# Patient Record
Sex: Female | Born: 1985 | Race: White | Hispanic: No | Marital: Married | Smoking: Former smoker
Health system: Southern US, Community
[De-identification: ages and names within clinical notes are randomized; demographics above are authoritative.]

## PROBLEM LIST (undated history)

## (undated) ENCOUNTER — Inpatient Hospital Stay: Payer: Self-pay

## (undated) DIAGNOSIS — O139 Gestational [pregnancy-induced] hypertension without significant proteinuria, unspecified trimester: Secondary | ICD-10-CM

## (undated) DIAGNOSIS — F909 Attention-deficit hyperactivity disorder, unspecified type: Secondary | ICD-10-CM

## (undated) DIAGNOSIS — A4902 Methicillin resistant Staphylococcus aureus infection, unspecified site: Secondary | ICD-10-CM

## (undated) DIAGNOSIS — Z720 Tobacco use: Secondary | ICD-10-CM

## (undated) DIAGNOSIS — F32A Depression, unspecified: Secondary | ICD-10-CM

## (undated) DIAGNOSIS — F329 Major depressive disorder, single episode, unspecified: Secondary | ICD-10-CM

## (undated) DIAGNOSIS — F419 Anxiety disorder, unspecified: Secondary | ICD-10-CM

## (undated) HISTORY — DX: Anxiety disorder, unspecified: F41.9

## (undated) HISTORY — PX: WISDOM TOOTH EXTRACTION: SHX21

## (undated) HISTORY — DX: Methicillin resistant Staphylococcus aureus infection, unspecified site: A49.02

## (undated) HISTORY — DX: Tobacco use: Z72.0

## (undated) HISTORY — DX: Major depressive disorder, single episode, unspecified: F32.9

## (undated) HISTORY — DX: Depression, unspecified: F32.A

---
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
## 2007-03-27 ENCOUNTER — Emergency Department: Payer: Self-pay | Admitting: Emergency Medicine

## 2015-01-20 ENCOUNTER — Ambulatory Visit (INDEPENDENT_AMBULATORY_CARE_PROVIDER_SITE_OTHER): Payer: Managed Care, Other (non HMO) | Admitting: Primary Care

## 2015-01-20 ENCOUNTER — Encounter: Payer: Self-pay | Admitting: Primary Care

## 2015-01-20 VITALS — BP 106/64 | HR 74 | Temp 98.3°F | Ht 65.0 in | Wt 118.4 lb

## 2015-01-20 DIAGNOSIS — Z720 Tobacco use: Secondary | ICD-10-CM | POA: Insufficient documentation

## 2015-01-20 MED ORDER — VARENICLINE TARTRATE 1 MG PO TABS: 1.0000 mg | ORAL_TABLET | Freq: Two times a day (BID) | ORAL | Status: DC

## 2015-01-20 MED ORDER — VARENICLINE TARTRATE 0.5 MG X 11 & 1 MG X 42 PO MISC: ORAL | Status: DC

## 2015-01-20 NOTE — Patient Instructions (Signed)
Start Chantix Starter Pak and follow package instructions. You should start the medication within 2-3 weeks of your quit date. You should determine a quit date prior to starting this medication.  Continue with the Continuing Pak once finished with the [employer]. If your insurance does not cover this medication, have the pharmacy to send a "Prior Authorization". We will complete this form.  It was a pleasure to meet you today! Please don't hesitate to call me with any questions. Welcome to [facility]!  Follow up in 1 month for re-evaluation.  Varenicline oral tablets What is this medicine? VARENICLINE (var EN i kleen) is used to help people quit smoking. It can reduce the symptoms caused by stopping smoking. It is used with a patient support program recommended by your physician. This medicine may be used for other purposes; ask your health care provider or pharmacist if you have questions. COMMON BRAND NAME(S): Chantix What should I tell my health care provider before I take this medicine? They need to know if you have any of these conditions: -bipolar disorder, depression, schizophrenia or other mental illness -heart disease -if you often drink alcohol -kidney disease -peripheral vascular disease -seizures -stroke -suicidal thoughts, plans, or attempt; a previous suicide attempt by you or a family member -an unusual or allergic reaction to varenicline, other medicines, foods, dyes, or preservatives -pregnant or trying to get pregnant -breast-feeding How should I use this medicine? You should set a date to stop smoking and tell your doctor. Start this medicine one week before the quit date. You can also start taking this medicine before you choose a quit date, and then pick a quit date that is between 8 and 35 days of treatment with this medicine. Stick to your plan; ask about support groups or other ways to help you remain a 'quitter'. Take this medicine by mouth after eating. Take with  a full glass of water. Follow the directions on the prescription label. Take your doses at regular intervals. Do not take your medicine more often than directed. A special MedGuide will be given to you by the pharmacist with each prescription and refill. Be sure to read this information carefully each time. Talk to your pediatrician regarding the use of this medicine in children. This medicine is not approved for use in children. Overdosage: If you think you have taken too much of this medicine contact a poison control center or emergency room at once. NOTE: This medicine is only for you. Do not share this medicine with others. What if I miss a dose? If you miss a dose, take it as soon as you can. If it is almost time for your next dose, take only that dose. Do not take double or extra doses. What may interact with this medicine? -alcohol or any product that contains alcohol -insulin -other stop smoking aids -theophylline -warfarin This list may not describe all possible interactions. Give your health care provider a list of all the medicines, herbs, non-prescription drugs, or dietary supplements you use. Also tell them if you smoke, drink alcohol, or use illegal drugs. Some items may interact with your medicine. What should I watch for while using this medicine? Visit your doctor or health care professional for regular check ups. Ask for ongoing advice and encouragement from your doctor or healthcare professional, friends, and family to help you quit. If you smoke while on this medication, quit again Your mouth may get dry. Chewing sugarless gum or sucking hard candy, and drinking plenty of  water may help. Contact your doctor if the problem does not go away or is severe. You may get drowsy or dizzy. Do not drive, use machinery, or do anything that needs mental alertness until you know how this medicine affects you. Do not stand or sit up quickly, especially if you are an older patient. This reduces  the risk of dizzy or fainting spells. The use of this medicine may increase the chance of suicidal thoughts or actions. Pay special attention to how you are responding while on this medicine. Any worsening of mood, or thoughts of suicide or dying should be reported to your health care professional right away. What side effects may I notice from receiving this medicine? Side effects that you should report to your doctor or health care professional as soon as possible: -allergic reactions like skin rash, itching or hives, swelling of the face, lips, tongue, or throat -breathing problems -changes in vision -chest pain or chest tightness -confusion, trouble speaking or understanding -fast, irregular heartbeat -feeling faint or lightheaded, falls -fever -pain in legs when walking -problems with balance, talking, walking -redness, blistering, peeling or loosening of the skin, including inside the mouth -ringing in ears -seizures -sudden numbness or weakness of the face, arm or leg -suicidal thoughts or other mood changes -trouble passing urine or change in the amount of urine -unusual bleeding or bruising -unusually weak or tired Side effects that usually do not require medical attention (report to your doctor or health care professional if they continue or are bothersome): -constipation -headache -nausea, vomiting -strange dreams -stomach gas -trouble sleeping This list may not describe all possible side effects. Call your doctor for medical advice about side effects. You may report side effects to FDA at 1-800-FDA-1088. Where should I keep my medicine? Keep out of the reach of children. Store at room temperature between 15 and 30 degrees C (59 and 86 degrees F). Throw away any unused medicine after the expiration date. NOTE: This sheet is a summary. It may not cover all possible information. If you have questions about this medicine, talk to your doctor, pharmacist, or health care  provider.  2015, Elsevier/Gold Standard. (2013-05-07 13:37:47)

## 2015-01-20 NOTE — Progress Notes (Signed)
Subjective:    Patient ID: Carrie Vasquez, female    DOB: 04-19-1986, 29 y.o.   MRN: [id number]  HPI  Carrie Vasquez is a 29 year old female who presents today to establish care and discuss the problems mentioned below.   1) Tobacco abuse: Smoked cigarettes for the past 10 years. She's recently cut back to 10 cigarettes daily from one pack per day. She's been working on quitting over the past year and has, at most, made it about 3 weeks before starting back up again. She's tried e-cigarettes, patches, gum, and quitting "cold [place]" without permanent cessation. She has an occasional cough and some shortness of breath when exercising. Denies hemoptysis and chest pain. She's has a history of snoring reports worsening snoring over the past several years. She does wake a night (per boyfriend), but doesn't recall, and will experience some daytime tiredness. She is very motivated to quit smoking.  Review of Systems  Constitutional: Negative for unexpected weight change.  HENT: Negative for rhinorrhea.   Respiratory: Negative for cough and shortness of breath.   Cardiovascular: Negative for chest pain.  Gastrointestinal: Negative for diarrhea and constipation.       Belly button crusting intermittently for years.  Genitourinary: Negative for dysuria and frequency.  Musculoskeletal: Negative for myalgias and arthralgias.  Skin: Negative for rash.  Allergic/Immunologic: Positive for environmental allergies.       Worse in Spring, will take local honey.  Neurological: Negative for dizziness and headaches.  Psychiatric/Behavioral:       Denies concerns for anxiety or depression.       Past Medical History  Diagnosis Date  . Tobacco abuse     History   Social History  . Marital Status: Unknown    Spouse Name: N/A  . Number of Children: N/A  . Years of Education: N/A   Occupational History  . Not on file.   Social History Main Topics  . Smoking status: Current Some Day Smoker  .  Smokeless tobacco: Not on file  . Alcohol Use: 0.0 oz/week    0 Standard drinks or equivalent per week  . Drug Use: Not on file  . Sexual Activity: Not on file   Other Topics Concern  . Not on file   Social History Narrative   Single. Getting married next May.   Works as a Hospital doctor.   Highest level of education completed GED with some college.   Enjoys working out, Tourist information centre manager.       Past Surgical History  Procedure Laterality Date  . Adenoidectomy  1996  . Appendectomy  1993    Family History  Problem Relation Age of Onset  . Hypertension Paternal Grandfather   . Diabetes Maternal Grandfather     Allergies  Allergen Reactions  . Sulfa Antibiotics Hives    No current outpatient prescriptions on file prior to visit.   No current facility-administered medications on file prior to visit.    BP 106/64 mmHg  Pulse 74  Temp(Src) 98.3 F (36.8 C) (Oral)  Ht 5\' 5"  (1.651 m)  Wt 118 lb 6.4 oz (53.706 kg)  BMI 19.70 kg/m2  SpO2 98%  LMP 01/13/2015     Objective:   Physical Exam  Constitutional: She appears well-nourished.  Cardiovascular: Normal rate and regular rhythm.   Pulmonary/Chest: Effort normal and breath sounds normal.  Abdominal: Soft. Bowel sounds are normal.  Skin: Skin is warm and dry.  Psychiatric: She has a normal mood and  affect.          Assessment & Plan:

## 2015-01-20 NOTE — Progress Notes (Signed)
Pre visit review using our clinic review tool, if applicable. No additional management support is needed unless otherwise documented below in the visit note. 

## 2015-01-20 NOTE — Assessment & Plan Note (Signed)
Smokes cigarettes for 10 years, 1 PPD. Numerous attempts to quit over 1 year including gum, patches, cold [place], without permanent cessation. RX for Chantix starting and continuing packs with specific instructions provided. Follow up in 1 month or sooner if needed.

## 2015-01-28 ENCOUNTER — Telehealth: Payer: Self-pay | Admitting: Primary Care

## 2015-02-05 ENCOUNTER — Telehealth: Payer: Self-pay | Admitting: Primary Care

## 2015-02-05 NOTE — Telephone Encounter (Signed)
I left a message for the patient to return my call.

## 2015-02-05 NOTE — Telephone Encounter (Signed)
Will you find out if Ms. Drinkard purchased the Chantix? I see that it was denied by her insurance.  Thanks

## 2015-02-05 NOTE — Telephone Encounter (Signed)
Received request for Prior Authorization for Chantix.  Started a PA on 01/29/15 but did not hear back from insurance. Sent  2nd PA to [employer] on 01/31/15. Recevied a fax from them that it should have went to [facility]. Sent  3rd PA to [facility] on 02/04/15.  [id number] PA was deny. The medication is a plan exclusion and is not a covered benefit on this patient's plan.

## 2015-02-20 ENCOUNTER — Ambulatory Visit: Payer: Managed Care, Other (non HMO) | Admitting: Primary Care

## 2015-02-28 LAB — OB RESULTS CONSOLE RUBELLA ANTIBODY, IGM: RUBELLA: IMMUNE

## 2015-02-28 LAB — OB RESULTS CONSOLE RPR: RPR: NONREACTIVE

## 2015-02-28 LAB — OB RESULTS CONSOLE HEPATITIS B SURFACE ANTIGEN: Hepatitis B Surface Ag: NEGATIVE

## 2015-02-28 LAB — OB RESULTS CONSOLE ABO/RH: RH TYPE: POSITIVE

## 2015-02-28 LAB — OB RESULTS CONSOLE ANTIBODY SCREEN: ANTIBODY SCREEN: NEGATIVE

## 2015-02-28 LAB — OB RESULTS CONSOLE VARICELLA ZOSTER ANTIBODY, IGG: Varicella: NON-IMMUNE/NOT IMMUNE

## 2015-02-28 LAB — OB RESULTS CONSOLE HIV ANTIBODY (ROUTINE TESTING): HIV: NONREACTIVE

## 2015-08-10 NOTE — L&D Delivery Note (Signed)
Deliver Note   Date of Delivery:   10/20/2015 Primary OB:   [facility] Gestational Age/EDD: 5458w0d by 10/20/2015, by Last Menstrual Period  Antepartum complications:  OB History    Gravida Para Term Preterm AB TAB SAB Ectopic Multiple Living   1 0 0 0 0 0 0 0 0 0       Delivered By:   Vena AustriaStaebler, Shatonya Passon MD  Delivery Type:   TSVD Anesthesia:     epidural  Intrapartum complications:  GBS:    Negative (02/17 0000) Laceration:    2nd degree Episiotomy:    none Placenta:    Spontaneous Estimated Blood Loss:  400mL Baby:     Liveborn female  APGAR (1 MIN):  8 APGAR (5 MINS): 9  Weight 8lbs 5oz, 3770g   Deliver Details   At 01:23 a viable female infant was delivered via  TSVD (Presentation: LOA  ).  APGAR: 8, 9; weight 8lbs 5oz, 3770g.   Placenta status: spontaneous, intact.  Cord: 3VC, nuchal x 1 with the following complications: induction of labor for gestational hypertension, gestational thrombocytopenia.    Mom to postpartum.  Baby to Couplet care / Skin to Skin.

## 2015-09-26 LAB — OB RESULTS CONSOLE GC/CHLAMYDIA
Chlamydia: NEGATIVE
GC PROBE AMP, GENITAL: NEGATIVE

## 2015-09-26 LAB — OB RESULTS CONSOLE GBS: GBS: NEGATIVE

## 2015-10-17 ENCOUNTER — Inpatient Hospital Stay
Admission: EM | Admit: 2015-10-17 | Discharge: 2015-10-17 | Disposition: A | Discharge status: Home / Self Care | Payer: Managed Care, Other (non HMO) | Source: Home / Self Care | Admitting: Obstetrics and Gynecology

## 2015-10-17 DIAGNOSIS — Z3A39 39 weeks gestation of pregnancy: Secondary | ICD-10-CM

## 2015-10-17 DIAGNOSIS — O133 Gestational [pregnancy-induced] hypertension without significant proteinuria, third trimester: Secondary | ICD-10-CM

## 2015-10-17 DIAGNOSIS — Z87891 Personal history of nicotine dependence: Secondary | ICD-10-CM

## 2015-10-17 LAB — COMPREHENSIVE METABOLIC PANEL
ALT: 16 U/L (ref 14–54)
AST: 21 U/L (ref 15–41)
Albumin: 3 g/dL — ABNORMAL LOW (ref 3.5–5.0)
Alkaline Phosphatase: 139 U/L — ABNORMAL HIGH (ref 38–126)
Anion gap: 6 (ref 5–15)
BILIRUBIN TOTAL: 0.8 mg/dL (ref 0.3–1.2)
BUN: 6 mg/dL (ref 6–20)
CO2: 21 mmol/L — ABNORMAL LOW (ref 22–32)
CREATININE: 0.61 mg/dL (ref 0.44–1.00)
Calcium: 8.5 mg/dL — ABNORMAL LOW (ref 8.9–10.3)
Chloride: 108 mmol/L (ref 101–111)
GFR calc Af Amer: >60 mL/min (ref >60)
Glucose, Bld: 76 mg/dL (ref 65–99)
Potassium: 3.5 mmol/L (ref 3.5–5.1)
Sodium: 135 mmol/L (ref 135–145)
TOTAL PROTEIN: 6.1 g/dL — AB (ref 6.5–8.1)

## 2015-10-17 LAB — CBC
HCT: 40.2 % (ref 35.0–47.0)
Hemoglobin: 13.9 g/dL (ref 12.0–16.0)
MCH: 31.5 pg (ref 26.0–34.0)
MCHC: 34.6 g/dL (ref 32.0–36.0)
MCV: 91.1 fL (ref 80.0–100.0)
PLATELETS: 120 10*3/uL — AB (ref 150–440)
RBC: 4.41 MIL/uL (ref 3.80–5.20)
RDW: 13.5 % (ref 11.5–14.5)
WBC: 8.4 10*3/uL (ref 3.6–11.0)

## 2015-10-17 LAB — PROTEIN / CREATININE RATIO, URINE
Creatinine, Urine: 88 mg/dL
Protein Creatinine Ratio: 0.17 mg/mg{creat} — ABNORMAL HIGH (ref 0.00–0.15)
Total Protein, Urine: 15 mg/dL

## 2015-10-17 MED ORDER — LABETALOL HCL 5 MG/ML IV SOLN
20.0000 mg | INTRAVENOUS | Status: DC | PRN
Start: 2015-10-17 — End: 2015-10-17

## 2015-10-17 MED ORDER — HYDRALAZINE HCL 20 MG/ML IJ SOLN: 10.0000 mg | Freq: Once | INTRAMUSCULAR | Status: DC | PRN

## 2015-10-17 NOTE — Discharge Summary (Signed)
Obstetric History and Physical  Carrie Vasquez is a 30 y.o. G1P0 with Estimated Date of Delivery: 10/20/15 per LMP and 9 wk [place] who presents at [redacted]w[redacted]d  presenting for evaluation of elevated BP reading at office today (130/90.) Patient states she has been having no contractions, no vaginal bleeding, intact membranes, with active fetal movement. Denies ha, visual changes or RUQ pain. C/o rt should pain.   Prenatal Course Source of Care: [facility]  with onset of care at 6 weeks Pregnancy complications or risks: -H/o anxiety/depression - no meds -h/o MRSA -H/o tobacco use  She plans to breastfeed She desires condoms for postpartum contraception.   Prenatal labs and studies: ABO, Rh: O+  Antibody: Neg Rubella: Immune Varicella: non-Immune RPR:  NR HBsAg:  Neg HIV: Neg GC/CT: Neg/Neg GBS: Negative 1 hr Glucola: 108   Genetic screening: Informaseq negative   TDAP: UTD   Prenatal Transfer Tool   Past Medical History  Diagnosis Date  . Tobacco abuse   -H/o MRSA with negative cultures during this pregnancy  Past Surgical History  Procedure Laterality Date  . Adenoidectomy  1996  . Appendectomy  1993  -Wisdom Teeth  OB History  Gravida Para Term Preterm AB SAB TAB Ectopic Multiple Living  1             # Outcome Date GA Lbr Len/2nd Weight Sex Delivery Anes PTL Lv  1 Current               Social History   Social History  . Marital Status: Unknown    Spouse Name: N/A  . Number of Children: N/A  . Years of Education: N/A   Social History Main Topics  . Smoking status: Former  . Smokeless tobacco: Not on file  . Alcohol Use: 0.0 oz/week    0 Standard drinks or equivalent per week  . Drug Use: Not on file  . Sexual Activity: Not on file   Other Topics Concern  . Not on file     Family History  Problem Relation Age of Onset  . Hypertension Paternal Grandfather   . Diabetes Maternal Grandfather     Prescriptions prior to admission  Medication Sig Dispense Refill  Last Dose  . valACYclovir (VALTREX) 500 MG tablet Take 1,000 mg by mouth daily.     . Prenatal Vitamins      .         Allergies  Allergen Reactions  . Sulfa Antibiotics Hives    Review of Systems: Negative except for what is mentioned in HPI.  Physical Exam: BP 134/91 mmHg  Pulse 90  Temp(Src) 97.9 F (36.6 C) (Oral)  Resp 18  Ht  (1.626 m)  Wt 160 lb (72.576 kg)  BMI 27.45 kg/m2  LMP 01/13/2015 BP: 139/94, 127/92, 130/91, 136/88, 142/87, 132/90, 134/90, 134/91 GENERAL: Well-developed, well-nourished female in no acute distress.  ABDOMEN: Soft, nontender, nondistended, gravid. EXTREMITIES: Nontender, no edema Cervical Exam: Dilatation 1 cm   Effacement 40-50%   Station -1   Presentation: cephalic FHT: Category: 1 Variability moderate  Accelerations present   Decelerations none Contractions: irregular   Pertinent Labs/Studies:   Results for orders placed or performed during the hospital encounter of 10/17/15 (from the past 24 hour(s))  Comprehensive metabolic panel     Status: Abnormal   Collection Time: 10/17/15 12:07 PM  Result Value Ref Range   Sodium 135 135 - 145 mmol/L   Potassium 3.5 3.5 - 5.1 mmol/L   Chloride 108  101 - 111 mmol/L   CO2 21 (L) 22 - 32 mmol/L   Glucose, Bld 76 65 - 99 mg/dL   BUN 6 6 - 20 mg/dL   Creatinine, Ser 1.610.61 0.44 - 1.00 mg/dL   Calcium 8.5 (L) 8.9 - 10.3 mg/dL   Total Protein 6.1 (L) 6.5 - 8.1 g/dL   Albumin 3.0 (L) 3.5 - 5.0 g/dL   AST 21 15 - 41 U/L   ALT 16 14 - 54 U/L   Alkaline Phosphatase 139 (H) 38 - 126 U/L   Total Bilirubin 0.8 0.3 - 1.2 mg/dL   GFR calc non Af Amer >60 >60 mL/min   GFR calc Af Amer >60 >60 mL/min   Anion gap 6 5 - 15  CBC     Status: Abnormal   Collection Time: 10/17/15 12:07 PM  Result Value Ref Range   WBC 8.4 3.6 - 11.0 K/uL   RBC 4.41 3.80 - 5.20 MIL/uL   Hemoglobin 13.9 12.0 - 16.0 g/dL   HCT 09.640.2 04.535.0 - 40.947.0 %   MCV 91.1 80.0 - 100.0 fL   MCH 31.5 26.0 - 34.0 pg   MCHC 34.6 32.0 - 36.0  g/dL   RDW 81.113.5 91.411.5 - 78.214.5 %   Platelets 120 (L) 150 - 440 K/uL  Protein / creatinine ratio, urine     Status: Abnormal   Collection Time: 10/17/15 12:07 PM  Result Value Ref Range   Creatinine, Urine 88 mg/dL   Total Protein, Urine 15 mg/dL   Protein Creatinine Ratio 0.17 (H) 0.00 - 0.15 mg/mg[Cre]    Assessment : IUP at 376w4d, gestational hypertension  Plan: GHTN: Reviewed dx of GHTN and recommendation for Induction of labor as she is >37 weeks. Reviewed progression of hypertension disease in pregnancy and risks to mom and baby including seizures and organ damage. Also reviewed risks of induction of labor and plan of management given current cervical dilation. Pt declined induction of labor today, verbalizing strong desire to forgo induction until at least due date. Decision made for pt to return to L&D tomorrow at 4 pm for evaluation and that she will most likely remain in-pt for induction of labor then.

## 2015-10-17 NOTE — OB Triage Note (Signed)
Carrie Vasquez sent over from office for pre-eclampsia evaluation, elevated BP in office, swelling in feet and ankles, right shoulder pain. Denies headaches, visual changes or any other pain. Reports positive fetal movement. 2+ pitting edema bilateral lower extremities.

## 2015-10-17 NOTE — Progress Notes (Signed)
Patient to return for NST and labs tomorrow, with possible IOL for gestational hypertension,

## 2015-10-18 ENCOUNTER — Inpatient Hospital Stay
Admission: EM | Admit: 2015-10-18 | Discharge: 2015-10-22 | DRG: 775 | Disposition: A | Discharge status: Home / Self Care | Payer: Managed Care, Other (non HMO) | Attending: Obstetrics and Gynecology | Admitting: Obstetrics and Gynecology

## 2015-10-18 DIAGNOSIS — O134 Gestational [pregnancy-induced] hypertension without significant proteinuria, complicating childbirth: Principal | ICD-10-CM | POA: Diagnosis present

## 2015-10-18 DIAGNOSIS — Z8249 Family history of ischemic heart disease and other diseases of the circulatory system: Secondary | ICD-10-CM

## 2015-10-18 DIAGNOSIS — Z882 Allergy status to sulfonamides status: Secondary | ICD-10-CM | POA: Diagnosis not present

## 2015-10-18 DIAGNOSIS — Z820 Family history of epilepsy and other diseases of the nervous system: Secondary | ICD-10-CM

## 2015-10-18 DIAGNOSIS — D6959 Other secondary thrombocytopenia: Secondary | ICD-10-CM | POA: Diagnosis present

## 2015-10-18 DIAGNOSIS — O163 Unspecified maternal hypertension, third trimester: Secondary | ICD-10-CM | POA: Diagnosis present

## 2015-10-18 DIAGNOSIS — Z806 Family history of leukemia: Secondary | ICD-10-CM | POA: Diagnosis not present

## 2015-10-18 DIAGNOSIS — Z8759 Personal history of other complications of pregnancy, childbirth and the puerperium: Secondary | ICD-10-CM | POA: Diagnosis present

## 2015-10-18 DIAGNOSIS — O9912 Other diseases of the blood and blood-forming organs and certain disorders involving the immune mechanism complicating childbirth: Secondary | ICD-10-CM | POA: Diagnosis present

## 2015-10-18 DIAGNOSIS — Z833 Family history of diabetes mellitus: Secondary | ICD-10-CM

## 2015-10-18 DIAGNOSIS — Z8614 Personal history of Methicillin resistant Staphylococcus aureus infection: Secondary | ICD-10-CM

## 2015-10-18 DIAGNOSIS — Z87891 Personal history of nicotine dependence: Secondary | ICD-10-CM

## 2015-10-18 DIAGNOSIS — Z9049 Acquired absence of other specified parts of digestive tract: Secondary | ICD-10-CM | POA: Diagnosis not present

## 2015-10-18 DIAGNOSIS — Z3A39 39 weeks gestation of pregnancy: Secondary | ICD-10-CM | POA: Diagnosis not present

## 2015-10-18 DIAGNOSIS — Z9889 Other specified postprocedural states: Secondary | ICD-10-CM

## 2015-10-18 DIAGNOSIS — O139 Gestational [pregnancy-induced] hypertension without significant proteinuria, unspecified trimester: Secondary | ICD-10-CM

## 2015-10-18 HISTORY — DX: Gestational (pregnancy-induced) hypertension without significant proteinuria, unspecified trimester: O13.9

## 2015-10-18 LAB — CBC
HCT: 40.3 % (ref 35.0–47.0)
Hemoglobin: 13.6 g/dL (ref 12.0–16.0)
MCH: 31.1 pg (ref 26.0–34.0)
MCHC: 33.8 g/dL (ref 32.0–36.0)
MCV: 92.1 fL (ref 80.0–100.0)
PLATELETS: 128 10*3/uL — AB (ref 150–440)
RBC: 4.37 MIL/uL (ref 3.80–5.20)
RDW: 13.8 % (ref 11.5–14.5)
WBC: 7.8 10*3/uL (ref 3.6–11.0)

## 2015-10-18 LAB — COMPREHENSIVE METABOLIC PANEL
ALT: 14 U/L (ref 14–54)
AST: 22 U/L (ref 15–41)
Albumin: 2.9 g/dL — ABNORMAL LOW (ref 3.5–5.0)
Alkaline Phosphatase: 151 U/L — ABNORMAL HIGH (ref 38–126)
Anion gap: 6 (ref 5–15)
BILIRUBIN TOTAL: 0.5 mg/dL (ref 0.3–1.2)
BUN: 10 mg/dL (ref 6–20)
CO2: 24 mmol/L (ref 22–32)
CREATININE: 0.65 mg/dL (ref 0.44–1.00)
Calcium: 9 mg/dL (ref 8.9–10.3)
Chloride: 106 mmol/L (ref 101–111)
Glucose, Bld: 75 mg/dL (ref 65–99)
POTASSIUM: 4 mmol/L (ref 3.5–5.1)
Sodium: 136 mmol/L (ref 135–145)
TOTAL PROTEIN: 5.7 g/dL — AB (ref 6.5–8.1)

## 2015-10-18 LAB — PROTEIN / CREATININE RATIO, URINE
Creatinine, Urine: 70 mg/dL
Protein Creatinine Ratio: 0.13 mg/mg{Cre} (ref 0.00–0.15)
TOTAL PROTEIN, URINE: 9 mg/dL

## 2015-10-18 LAB — ABO/RH: ABO/RH(D): O POS

## 2015-10-18 LAB — TYPE AND SCREEN
ABO/RH(D): O POS
ANTIBODY SCREEN: NEGATIVE

## 2015-10-18 MED ORDER — LIDOCAINE HCL (PF) 1 % IJ SOLN
INTRAMUSCULAR | Status: AC
  Filled 2015-10-18: qty 30

## 2015-10-18 MED ORDER — TERBUTALINE SULFATE 1 MG/ML IJ SOLN
0.2500 mg | Freq: Once | INTRAMUSCULAR | Status: AC | PRN
  Administered 2015-10-18: 0.25 mg via SUBCUTANEOUS

## 2015-10-18 MED ORDER — AMMONIA AROMATIC IN INHA
RESPIRATORY_TRACT | Status: AC
  Filled 2015-10-18: qty 10

## 2015-10-18 MED ORDER — ACETAMINOPHEN 325 MG PO TABS: 650.0000 mg | ORAL_TABLET | ORAL | Status: DC | PRN

## 2015-10-18 MED ORDER — LACTATED RINGERS IV SOLN: 500.0000 mL | INTRAVENOUS | Status: DC | PRN

## 2015-10-18 MED ORDER — MISOPROSTOL 200 MCG PO TABS
ORAL_TABLET | ORAL | Status: AC
  Filled 2015-10-18: qty 4

## 2015-10-18 MED ORDER — OXYTOCIN 40 UNITS IN LACTATED RINGERS INFUSION - SIMPLE MED
2.5000 [IU]/h | INTRAVENOUS | Status: DC
  Administered 2015-10-20: 2.5 [IU]/h via INTRAVENOUS

## 2015-10-18 MED ORDER — TERBUTALINE SULFATE 1 MG/ML IJ SOLN
INTRAMUSCULAR | Status: AC
  Administered 2015-10-18: 0.25 mg via SUBCUTANEOUS
  Filled 2015-10-18: qty 1

## 2015-10-18 MED ORDER — OXYTOCIN 10 UNIT/ML IJ SOLN
INTRAMUSCULAR | Status: DC
Start: 2015-10-18 — End: 2015-10-20
  Filled 2015-10-18: qty 2

## 2015-10-18 MED ORDER — ONDANSETRON HCL 4 MG/2ML IJ SOLN
4.0000 mg | Freq: Four times a day (QID) | INTRAMUSCULAR | Status: DC | PRN
  Administered 2015-10-19: 4 mg via INTRAVENOUS
  Filled 2015-10-18: qty 2

## 2015-10-18 MED ORDER — LIDOCAINE HCL (PF) 1 % IJ SOLN: 30.0000 mL | INTRAMUSCULAR | Status: DC | PRN

## 2015-10-18 MED ORDER — LACTATED RINGERS IV SOLN: INTRAVENOUS | Status: DC

## 2015-10-18 MED ORDER — CITRIC ACID-SODIUM CITRATE 334-500 MG/5ML PO SOLN
30.0000 mL | ORAL | Status: DC | PRN
Start: 2015-10-18 — End: 2015-10-20

## 2015-10-18 MED ORDER — ACETAMINOPHEN 325 MG PO TABS
650.0000 mg | ORAL_TABLET | ORAL | Status: DC | PRN
  Administered 2015-10-18: 650 mg via ORAL
  Filled 2015-10-18: qty 2

## 2015-10-18 MED ORDER — DINOPROSTONE 10 MG VA INST
10.0000 mg | VAGINAL_INSERT | Freq: Once | VAGINAL | Status: AC
  Administered 2015-10-18: 10 mg via VAGINAL
  Filled 2015-10-18: qty 1

## 2015-10-18 MED ORDER — OXYTOCIN 40 UNITS IN LACTATED RINGERS INFUSION - SIMPLE MED
INTRAVENOUS | Status: AC
  Administered 2015-10-19: 1 m[IU]/min via INTRAVENOUS
  Filled 2015-10-18: qty 1000

## 2015-10-18 MED ORDER — OXYTOCIN BOLUS FROM INFUSION: 500.0000 mL | INTRAVENOUS | Status: DC

## 2015-10-18 MED ORDER — BUTORPHANOL TARTRATE 1 MG/ML IJ SOLN: 1.0000 mg | INTRAMUSCULAR | Status: DC | PRN

## 2015-10-18 NOTE — H&P (Signed)
Obstetric H&P   Chief Complaint: Elevated blood pressures  Prenatal Care Provider: [facility]  History of Present Illness: 30 y.o. G1P0000 [redacted]w[redacted]d by 10/20/2015, presenting for follow up of gestational hypertension, gestational thrombocytopenia diagnosed yesterday.  No headaches, vision changes, RUQ/epigastric pain, increased edema.  Reports +FM, no LOF, no VB, irregualar contractions  Pregnancy has otherwise been uncomplicated  ABO, Rh: O/Positive/-- (07/22 0000)  Antibody: Negative (07/22 0000)  Rubella: Immune Varicella: Immune RPR: Nonreactive (07/22 0000)  HBsAg: Negative (07/22 0000)  HIV: Non-reactive (07/22 0000)  1-hr: 108 GBS: Negative (02/17 0000)   TDAP 08/15/15   Review of Systems: 10 point review of systems negative unless otherwise noted in HPI  Past Medical History: Past Medical History  Diagnosis Date  . Tobacco abuse   . Depression     does not take medication  . Anxiety     does not take medication  . MRSA (methicillin resistant Staphylococcus aureus)     h/o MRSA = 3-4 years ago = cleared = culture 7/22 negative  . Pregnancy induced hypertension   . Gestational hypertension 10/18/2015    Past Surgical History: Past Surgical History  Procedure Laterality Date  . Adenoidectomy  1996  . Appendectomy  1993  . Wisdom tooth extraction      one removed at age 72    Family History: Family History  Problem Relation Age of Onset  . Hypertension Paternal Grandfather   . Diabetes Maternal Grandfather   . Cancer Maternal Grandfather     leukemia  . Epilepsy Brother     Social History: Social History   Social History  . Marital Status: Unknown    Spouse Name: N/A  . Number of Children: N/A  . Years of Education: N/A   Occupational History  . Not on file.   Social History Main Topics  . Smoking status: Former Smoker    Quit date: 02/14/2015  . Smokeless tobacco: Not on file  . Alcohol Use: No  . Drug Use: No  . Sexual Activity: Yes   Other Topics  Concern  . Not on file   Social History Narrative   Single. Getting married next May.   Works as a Hospital doctor.   Highest level of education completed GED with some college.   Enjoys working out, Tourist information centre manager.       Medications: Prior to Admission medications   Medication Sig Start Date End Date Taking? Authorizing Provider  Prenatal Vit-Fe Fumarate-FA (PRENATAL MULTIVITAMIN) TABS tablet Take 1 tablet by mouth daily at 12 noon.   Yes Historical Provider, MD  valACYclovir (VALTREX) 500 MG tablet Take 1,000 mg by mouth daily.   Yes Historical Provider, MD    Allergies: Allergies  Allergen Reactions  . Sulfa Antibiotics Hives    Physical Exam: Vitals: Blood pressure 140/98, pulse 75, temperature 98.5 F (36.9 C), temperature source Oral, resp. rate 18, height  (1.626 m), weight 72.576 kg (160 lb), last menstrual period 01/13/2015, SpO2 97 %.  Urine Dip Protein: see P/C ratio  FHT: 140, moderate variability, +accels, no decels Toco: absent  General: NAD HEENT: normocephalic, anicteric Pulmonary: no increased work of breathing Abdomen: Gravid,  Non-tender  [place]: vtx Extremities: no edema  Labs: Results for orders placed or performed during the hospital encounter of 10/18/15 (from the past 24 hour(s))  Protein / creatinine ratio, urine     Status: None   Collection Time: 10/18/15  5:08 PM  Result Value Ref Range   Creatinine,  Urine 70 mg/dL   Total Protein, Urine 9 mg/dL   Protein Creatinine Ratio 0.13 0.00 - 0.15 mg/mg[Cre]  Comprehensive metabolic panel     Status: Abnormal   Collection Time: 10/18/15  5:21 PM  Result Value Ref Range   Sodium 136 135 - 145 mmol/L   Potassium 4.0 3.5 - 5.1 mmol/L   Chloride 106 101 - 111 mmol/L   CO2 24 22 - 32 mmol/L   Glucose, Bld 75 65 - 99 mg/dL   BUN 10 6 - 20 mg/dL   Creatinine, Ser 1.610.65 0.44 - 1.00 mg/dL   Calcium 9.0 8.9 - 09.610.3 mg/dL   Total Protein 5.7 (L) 6.5 - 8.1 g/dL   Albumin 2.9 (L)  3.5 - 5.0 g/dL   AST 22 15 - 41 U/L   ALT 14 14 - 54 U/L   Alkaline Phosphatase 151 (H) 38 - 126 U/L   Total Bilirubin 0.5 0.3 - 1.2 mg/dL   GFR calc non Af Amer >60 >60 mL/min   GFR calc Af Amer >60 >60 mL/min   Anion gap 6 5 - 15  CBC     Status: Abnormal   Collection Time: 10/18/15  5:21 PM  Result Value Ref Range   WBC 7.8 3.6 - 11.0 K/uL   RBC 4.37 3.80 - 5.20 MIL/uL   Hemoglobin 13.6 12.0 - 16.0 g/dL   HCT 04.540.3 40.935.0 - 81.147.0 %   MCV 92.1 80.0 - 100.0 fL   MCH 31.1 26.0 - 34.0 pg   MCHC 33.8 32.0 - 36.0 g/dL   RDW 91.413.8 78.211.5 - 95.614.5 %   Platelets 128 (L) 150 - 440 K/uL    Assessment: 30 y.o. G1P0000 8220w5d by 10/20/2015, with GHTN  Plan: 1) IOL - cervidil tonight  2) Fetus - cat I tracing 1-hr 108 39lbs weight gain this pregnacy  3) GHTN - normotensive to mild range, labs stable from yesterday  4) TDAP - 08/15/15  5) Disposition - pending delivery

## 2015-10-19 ENCOUNTER — Inpatient Hospital Stay: Payer: Managed Care, Other (non HMO) | Admitting: Anesthesiology

## 2015-10-19 ENCOUNTER — Encounter: Payer: Self-pay | Admitting: *Deleted

## 2015-10-19 LAB — PLATELET COUNT: Platelets: 108 10*3/uL — ABNORMAL LOW (ref 150–440)

## 2015-10-19 MED ORDER — PHENYLEPHRINE 40 MCG/ML (10ML) SYRINGE FOR IV PUSH (FOR BLOOD PRESSURE SUPPORT)
80.0000 ug | PREFILLED_SYRINGE | INTRAVENOUS | Status: DC | PRN
  Filled 2015-10-19: qty 2

## 2015-10-19 MED ORDER — FENTANYL 2.5 MCG/ML W/ROPIVACAINE 0.2% IN NS 100 ML EPIDURAL INFUSION (ARMC-ANES)
EPIDURAL | Status: AC
  Administered 2015-10-19: 10 mL/h via EPIDURAL
  Filled 2015-10-19: qty 100

## 2015-10-19 MED ORDER — EPHEDRINE 5 MG/ML INJ
10.0000 mg | INTRAVENOUS | Status: DC | PRN
  Filled 2015-10-19: qty 2

## 2015-10-19 MED ORDER — SODIUM CHLORIDE 0.9 % IJ SOLN
INTRAMUSCULAR | Status: AC
  Filled 2015-10-19: qty 50

## 2015-10-19 MED ORDER — OXYTOCIN 40 UNITS IN LACTATED RINGERS INFUSION - SIMPLE MED
1.0000 m[IU]/min | INTRAVENOUS | Status: DC
  Administered 2015-10-19: 1 m[IU]/min via INTRAVENOUS
  Administered 2015-10-20: 02:00:00 via INTRAVENOUS

## 2015-10-19 MED ORDER — BUTORPHANOL TARTRATE 1 MG/ML IJ SOLN: 1.0000 mg | INTRAMUSCULAR | Status: DC | PRN

## 2015-10-19 MED ORDER — BUPIVACAINE HCL (PF) 0.25 % IJ SOLN
INTRAMUSCULAR | Status: DC | PRN
  Administered 2015-10-19: 5 mL via EPIDURAL

## 2015-10-19 MED ORDER — DIPHENHYDRAMINE HCL 50 MG/ML IJ SOLN: 12.5000 mg | INTRAMUSCULAR | Status: DC | PRN

## 2015-10-19 MED ORDER — SODIUM CHLORIDE 0.9 % IJ SOLN
INTRAMUSCULAR | Status: AC
  Administered 2015-10-19: 3 mL
  Filled 2015-10-19: qty 10

## 2015-10-19 MED ORDER — FENTANYL 2.5 MCG/ML W/ROPIVACAINE 0.2% IN NS 100 ML EPIDURAL INFUSION (ARMC-ANES)
10.0000 mL/h | EPIDURAL | Status: DC
  Administered 2015-10-19: 10 mL/h via EPIDURAL

## 2015-10-19 MED ORDER — LACTATED RINGERS IV SOLN: 500.0000 mL | Freq: Once | INTRAVENOUS | Status: DC

## 2015-10-19 MED ORDER — SODIUM CHLORIDE 0.9% FLUSH
3.0000 mL | Freq: Three times a day (TID) | INTRAVENOUS | Status: DC
  Administered 2015-10-19 (×3): 3 mL via INTRAVENOUS

## 2015-10-19 MED ORDER — FENTANYL CITRATE (PF) 100 MCG/2ML IJ SOLN
INTRAMUSCULAR | Status: AC
  Filled 2015-10-19: qty 2

## 2015-10-19 MED ORDER — TERBUTALINE SULFATE 1 MG/ML IJ SOLN: 0.2500 mg | Freq: Once | INTRAMUSCULAR | Status: DC | PRN

## 2015-10-19 NOTE — Anesthesia Preprocedure Evaluation (Signed)
Anesthesia Evaluation  Patient identified by MRN, date of birth, ID band Patient awake    Reviewed: Allergy & Precautions, NPO status , Patient's Chart, lab work & pertinent test results, reviewed documented beta blocker date and time   Airway Mallampati: II  TM Distance: >3 FB     Dental  (+) Chipped   Pulmonary former smoker,           Cardiovascular hypertension,      Neuro/Psych PSYCHIATRIC DISORDERS Anxiety Depression    GI/Hepatic   Endo/Other    Renal/GU      Musculoskeletal   Abdominal   Peds  Hematology   Anesthesia Other Findings   Reproductive/Obstetrics                             Anesthesia Physical Anesthesia Plan  ASA: II  Anesthesia Plan: Epidural   Post-op Pain Management:    Induction:   Airway Management Planned:   Additional Equipment:   Intra-op Plan:   Post-operative Plan:   Informed Consent: I have reviewed the patients History and Physical, chart, labs and discussed the procedure including the risks, benefits and alternatives for the proposed anesthesia with the patient or authorized representative who has indicated his/her understanding and acceptance.     Plan Discussed with: CRNA  Anesthesia Plan Comments:         Anesthesia Quick Evaluation

## 2015-10-19 NOTE — Progress Notes (Signed)
Report received from J. Harvest Darkannady, Charity fundraiserN. In room to assume care, bedside report completed. Pt in bed on hand and knees, moaning and breathing through contractions. Pitocin started recently and pt desires natural labor. In room to assume care, introduced self to pt, s/o and family present in room. Discussed plan for shift. Questions addressed, understanding and agreement with plan verbalized

## 2015-10-19 NOTE — Progress Notes (Signed)
Subjective:  Painful contractions, slightly less frequent than earlier this afternoon.  Doing nipple stimulation  Objective:   Vitals: Blood pressure 137/95, pulse 83, temperature 98.7 F (37.1 C), temperature source Oral, resp. rate 18, height 5\' 4"  (1.626 m), weight 72.576 kg (160 lb), last menstrual period 01/13/2015, SpO2 97 %. General:  Abdomen: Cervical Exam:  Dilation: 3.5 Effacement (%): 80 Cervical Position: Anterior Station: 0 Presentation: Vertex Exam by:: JTC  FHT: 130, moderate variability, +accels, no decels Toco: q3-525min  Results for orders placed or performed during the hospital encounter of 10/18/15 (from the past 24 hour(s))  Type and screen [facility]     Status: None   Collection Time: 10/18/15  6:50 PM  Result Value Ref Range   ABO/RH(D) O POS    Antibody Screen NEG    Sample Expiration 10/21/2015   ABO/Rh     Status: None   Collection Time: 10/18/15  6:51 PM  Result Value Ref Range   ABO/RH(D) O POS     Assessment:   29 y.o. G1P0000 6025w6d   Plan:   1) Labor - prolonged latent phase no change since noon check will start pitocin for augmentation  2) Fetus - cat I tracing  3) GHTN - normotensive to mild range, will obtain platelet count in case patient desires epidural, prn stadol written

## 2015-10-19 NOTE — Progress Notes (Signed)
Dr Bonney AidStaebler in room speaking with pt about labor progress and Epidural. Discussed recent lab results. Pt agrees she would like to have an Epidural now. LR bolus started. Dr Maisie Fushomas, anesthesia, called and notified. PLT 108.

## 2015-10-19 NOTE — Anesthesia Procedure Notes (Signed)
Epidural Patient location during procedure: OB  Staffing Anesthesiologist: Berdine AddisonHOMAS, Deaglan Lile Performed by: anesthesiologist   Preanesthetic Checklist Completed: patient identified, site marked, surgical consent, pre-op evaluation, timeout performed, IV checked, risks and benefits discussed and monitors and equipment checked  Epidural Patient position: sitting Prep: Betadine Patient monitoring: heart rate, continuous pulse ox and blood pressure Approach: midline Location: L4-L5 Injection technique: LOR saline  Needle:  Needle type: Tuohy  Needle gauge: 18 G Needle length: 9 cm and 9 Catheter type: closed end flexible Catheter size: 20 Guage Test dose: negative and 1.5% lidocaine with Epi 1:200 K  Assessment Sensory level: T10 Events: blood not aspirated, injection not painful, no injection resistance, negative IV test and no paresthesia  Additional Notes   Patient tolerated the insertion well without complications.1940 in. 1956 catheter. 1958 test. 2001 bolus 2010 infusion.Reason for block:procedure for pain

## 2015-10-19 NOTE — Progress Notes (Signed)
Subjective:  Comfortable  Objective:   Vitals: Blood pressure 113/69, pulse 77, temperature 98.5 F (36.9 C), temperature source Oral, resp. rate 16, height 5\' 4"  (1.626 m), weight 72.576 kg (160 lb), last menstrual period 01/13/2015, SpO2 97 %. General: NAD Abdomen: gravid, non-tender Cervical Exam:  Dilation: 10 Dilation Complete Date: 10/19/15 Dilation Complete Time: 2245 Effacement (%): 100 Cervical Position: Anterior Station: 0 Presentation: Vertex Exam by:: Colletta MarylandN. Sullivan, RN  FHT: 130, moderate, positive accels, no decels Toco:   Results for orders placed or performed during the hospital encounter of 10/18/15 (from the past 24 hour(s))  Platelet count     Status: Abnormal   Collection Time: 10/19/15  5:59 PM  Result Value Ref Range   Platelets 108 (L) 150 - 440 K/uL    Assessment:   30 y.o. G1P0000 3132w6d IOL GHTN  Plan:   1) Labor - continue pitocin, not having much feeling or feedback when pushing because of epidural will allow to labor down over the next hour then start pushing  2) Fetus - category I tracing  3) GHTN - mild range BP, platelets down to 108 last check

## 2015-10-19 NOTE — Progress Notes (Signed)
Dr Maisie Fushomas in room to place Epidural. Pt assisted with getting into position. LR bolus infusing, procedure explained to pt. s/o standing in front of pt, supportive and coaching pt on breathing through contractions. FHR 130's. 1945 - timeout performed for procedure. Consent signed and in chart 1956 - Epidural cath placed, pt tolerating procedure well 1958 - 3cc of 1.5% lidocaine with epi injected. Pt denies any unusual sensation or ringing in ear. Pt remains awake and alert, able to wiggle all toes and responding appropriately.  2001 - loading bolus, 5cc 0.25 % marcaine injected 2003 - Epidural dressing placed, pt assisted with lying down on Lt side. Still feeling pain and pressure in R+ L hips and back. Peanut ball placed between legs, pt states that already feels better.  2010 - pump setup and started at 20cc/hr via alaris pump, per Dr Maisie Fushomas verbal order received to change rate to 10cc/hr in an hour.

## 2015-10-19 NOTE — Progress Notes (Signed)
Late entry Subjective:  Doing well increasing intensity of contractions  Objective:   Vitals: Blood pressure 134/89, pulse 74, temperature 98.8 F (37.1 C), temperature source Oral, resp. rate 16, height 5\' 4"  (1.626 m), weight 72.576 kg (160 lb), last menstrual period 01/13/2015, SpO2 97 %. General: NAD Abdomen: gravid non-tender Cervical Exam: 2/50/-3 per nursing clear SROM 0700  Cat I tracing contraction q623min  Results for orders placed or performed during the hospital encounter of 10/18/15 (from the past 24 hour(s))  Protein / creatinine ratio, urine     Status: None   Collection Time: 10/18/15  5:08 PM  Result Value Ref Range   Creatinine, Urine 70 mg/dL   Total Protein, Urine 9 mg/dL   Protein Creatinine Ratio 0.13 0.00 - 0.15 mg/mg[Cre]  Comprehensive metabolic panel     Status: Abnormal   Collection Time: 10/18/15  5:21 PM  Result Value Ref Range   Sodium 136 135 - 145 mmol/L   Potassium 4.0 3.5 - 5.1 mmol/L   Chloride 106 101 - 111 mmol/L   CO2 24 22 - 32 mmol/L   Glucose, Bld 75 65 - 99 mg/dL   BUN 10 6 - 20 mg/dL   Creatinine, Ser 1.610.65 0.44 - 1.00 mg/dL   Calcium 9.0 8.9 - 09.610.3 mg/dL   Total Protein 5.7 (L) 6.5 - 8.1 g/dL   Albumin 2.9 (L) 3.5 - 5.0 g/dL   AST 22 15 - 41 U/L   ALT 14 14 - 54 U/L   Alkaline Phosphatase 151 (H) 38 - 126 U/L   Total Bilirubin 0.5 0.3 - 1.2 mg/dL   GFR calc non Af Amer >60 >60 mL/min   GFR calc Af Amer >60 >60 mL/min   Anion gap 6 5 - 15  CBC     Status: Abnormal   Collection Time: 10/18/15  5:21 PM  Result Value Ref Range   WBC 7.8 3.6 - 11.0 K/uL   RBC 4.37 3.80 - 5.20 MIL/uL   Hemoglobin 13.6 12.0 - 16.0 g/dL   HCT 04.540.3 40.935.0 - 81.147.0 %   MCV 92.1 80.0 - 100.0 fL   MCH 31.1 26.0 - 34.0 pg   MCHC 33.8 32.0 - 36.0 g/dL   RDW 91.413.8 78.211.5 - 95.614.5 %   Platelets 128 (L) 150 - 440 K/uL  Type and screen [facility]     Status: None   Collection Time: 10/18/15  6:50 PM  Result Value Ref Range   ABO/RH(D) O POS     Antibody Screen NEG    Sample Expiration 10/21/2015   ABO/Rh     Status: None   Collection Time: 10/18/15  6:51 PM  Result Value Ref Range   ABO/RH(D) O POS     Assessment:   29 y.o. G1P0000 8851w6d IOL for GHTN at term  Plan:   1) Labor - had tachysystole on cervidil which was removed shortly after placement, has continued to progress unassisted since that time  2) Fetus - cat I tracing  3) GHTN - normotensive to mild range BPs

## 2015-10-20 ENCOUNTER — Encounter: Payer: Self-pay | Admitting: *Deleted

## 2015-10-20 LAB — CBC
HCT: 35 % (ref 35.0–47.0)
Hemoglobin: 11.9 g/dL — ABNORMAL LOW (ref 12.0–16.0)
MCH: 31.3 pg (ref 26.0–34.0)
MCHC: 33.9 g/dL (ref 32.0–36.0)
MCV: 92.2 fL (ref 80.0–100.0)
PLATELETS: 111 10*3/uL — AB (ref 150–440)
RBC: 3.8 MIL/uL (ref 3.80–5.20)
RDW: 13.7 % (ref 11.5–14.5)
WBC: 13.9 10*3/uL — ABNORMAL HIGH (ref 3.6–11.0)

## 2015-10-20 LAB — RPR: RPR Ser Ql: NONREACTIVE

## 2015-10-20 MED ORDER — SIMETHICONE 80 MG PO CHEW: 80.0000 mg | CHEWABLE_TABLET | ORAL | Status: DC | PRN

## 2015-10-20 MED ORDER — ACETAMINOPHEN 325 MG PO TABS: 650.0000 mg | ORAL_TABLET | ORAL | Status: DC | PRN

## 2015-10-20 MED ORDER — WITCH HAZEL-GLYCERIN EX PADS: 1.0000 "application " | MEDICATED_PAD | CUTANEOUS | Status: DC | PRN

## 2015-10-20 MED ORDER — ONDANSETRON HCL 4 MG PO TABS: 4.0000 mg | ORAL_TABLET | ORAL | Status: DC | PRN

## 2015-10-20 MED ORDER — DIPHENHYDRAMINE HCL 25 MG PO CAPS: 25.0000 mg | ORAL_CAPSULE | Freq: Four times a day (QID) | ORAL | Status: DC | PRN

## 2015-10-20 MED ORDER — BENZOCAINE-MENTHOL 20-0.5 % EX AERO
1.0000 "application " | INHALATION_SPRAY | CUTANEOUS | Status: DC | PRN
  Administered 2015-10-20 – 2015-10-21 (×2): 1 via TOPICAL
  Filled 2015-10-20 (×2): qty 56

## 2015-10-20 MED ORDER — OXYCODONE-ACETAMINOPHEN 5-325 MG PO TABS
2.0000 | ORAL_TABLET | ORAL | Status: DC | PRN
  Administered 2015-10-21 (×2): 2 via ORAL
  Filled 2015-10-20 (×2): qty 2

## 2015-10-20 MED ORDER — LANOLIN HYDROUS EX OINT: TOPICAL_OINTMENT | CUTANEOUS | Status: DC | PRN

## 2015-10-20 MED ORDER — PRENATAL MULTIVITAMIN CH
1.0000 | ORAL_TABLET | Freq: Every day | ORAL | Status: DC
  Administered 2015-10-20 – 2015-10-22 (×3): 1 via ORAL
  Filled 2015-10-20 (×3): qty 1

## 2015-10-20 MED ORDER — DIBUCAINE 1 % RE OINT: 1.0000 "application " | TOPICAL_OINTMENT | RECTAL | Status: DC | PRN

## 2015-10-20 MED ORDER — IBUPROFEN 600 MG PO TABS
600.0000 mg | ORAL_TABLET | Freq: Four times a day (QID) | ORAL | Status: DC
  Administered 2015-10-20 – 2015-10-22 (×10): 600 mg via ORAL
  Filled 2015-10-20 (×10): qty 1

## 2015-10-20 MED ORDER — ONDANSETRON HCL 4 MG/2ML IJ SOLN: 4.0000 mg | INTRAMUSCULAR | Status: DC | PRN

## 2015-10-20 MED ORDER — INFLUENZA VAC SPLIT QUAD 0.5 ML IM SUSY
0.5000 mL | PREFILLED_SYRINGE | Freq: Once | INTRAMUSCULAR | Status: AC
  Administered 2015-10-22: 0.5 mL via INTRAMUSCULAR
  Filled 2015-10-20: qty 0.5

## 2015-10-20 MED ORDER — SENNOSIDES-DOCUSATE SODIUM 8.6-50 MG PO TABS
2.0000 | ORAL_TABLET | ORAL | Status: DC
  Administered 2015-10-20 – 2015-10-21 (×2): 2 via ORAL
  Filled 2015-10-20 (×2): qty 2

## 2015-10-20 MED ORDER — OXYCODONE-ACETAMINOPHEN 5-325 MG PO TABS
1.0000 | ORAL_TABLET | ORAL | Status: DC | PRN
  Administered 2015-10-21 – 2015-10-22 (×3): 1 via ORAL
  Filled 2015-10-20 (×3): qty 1

## 2015-10-20 NOTE — Progress Notes (Signed)
Pt completed, 0 to +1 station, feeling very little urge to push, trial pushes x2 performed. Pt laboring down now and will resume pushing when feeling more pressure and urges to push.   2334  - RN in room to see pt, reports she is feeling pressure during contractions, not painful but causing her to have urge to push. Discussed with pt, positioning for pushing while feeling pressure during contractions.   0005 - Pt agrees with plan to labor down since minimal descent noted with pushing. Pt denies feeling tired but would like to not become exhausted.

## 2015-10-20 NOTE — Plan of Care (Signed)
Problem: Activity: Goal: Will verbalize the importance of balancing activity with adequate rest periods Outcome: Progressing SVD term female infant, nuchal x1, terminal mec at delivery. Infant delivered without any complications, 2nd deg perineal lac with repair performed. Infant placed skin to skin and breastfeeding since delivery.

## 2015-10-20 NOTE — Discharge Summary (Signed)
Obstetric Discharge Summary Reason for Admission: Induction of labor for gestational hypertension Prenatal Procedures: none Intrapartum Procedures: spontaneous vaginal delivery  10/20/2015 Postpartum Procedures: none Complications-Operative and Postpartum: none HEMOGLOBIN  Date Value Ref Range Status  10/20/2015 11.9* 12.0 - 16.0 g/dL Final   HCT  Date Value Ref Range Status  10/20/2015 35.0 35.0 - 47.0 % Final   Last plt count 111K, up from 108K O POS/ RI/ VNI/ GBS negative/ TDAP UTD Varivax ordered Flu vaccine ordered  Physical Exam:  BP 126/91 mmHg  Pulse 71  Temp(Src) 98.4 F (36.9 C) (Oral)  Resp 16  Ht 5\' 4"  (1.626 m)  Wt 72.576 kg (160 lb)  BMI 27.45 kg/m2  SpO2 99%  LMP 01/13/2015  Breastfeeding? Unknown  BP range last 24 hours 109/64-126/91  General: alert, appears stated age and no distress / developed a local rash on her abdomen, itchy, maculopapular Breasts: nipples a little briused Lochia: appropriate Uterine Fundus: firm/ below umbilicus Perineum: intact, no edema or bruising DVT Evaluation: No evidence of DVT seen on physical exam. Edema of LE present RT>LT  Discharge Diagnoses: Term Pregnancy-delivered/ gestational hypertension/ gestational thrombocytopenia  Discharge Information: Date: 10/22/2015 Activity: pelvic rest Diet: routine   Medication List    STOP taking these medications        valACYclovir 500 MG tablet  Commonly known as:  VALTREX      TAKE these medications        ibuprofen 600 MG tablet  Commonly known as:  ADVIL,MOTRIN  Take 1 tablet (600 mg total) by mouth every 6 (six) hours as needed for mild pain or moderate pain.     oxyCODONE-acetaminophen 5-325 MG tablet  Commonly known as:  PERCOCET/ROXICET  Take 1-2 tablets by mouth every 4 (four) hours as needed for moderate pain or severe pain.     prenatal multivitamin Tabs tablet  Take 1 tablet by mouth daily at 12 noon.     triamcinolone 0.1 % cream : eucerin Crea   Apply 1 application topically 2 (two) times daily as needed.        Condition: stable Discharge to: home Follow-up Information    Follow up with Lorrene ReidSTAEBLER, ANDREAS M, MD In 1 week.   Specialty:  Obstetrics and Gynecology   Why:  BP check   Contact information:   [address] [place] [address] [phone]       Newborn Data: Live born female / Carrie Vasquez Birth Weight:  8#5oz APGAR: 8 ,9   Home with mother.  Carrie Vasquez 10/22/2015, 9:41 AM

## 2015-10-21 NOTE — Progress Notes (Signed)
Post Partum Day 1/ IOL for gestational hypertension and gestational thrombocytopenia Subjective: Voiding without difficulty, tolerating a regular diet. Minimal cramping. Nipples sore: working with Advertising copywriterlactation consultant on latch.   Objective: Blood pressure 121/84, pulse 70, temperature 98.2 F (36.8 C), temperature source Oral, resp. rate 18, height 5\' 4"  (1.626 m), weight 72.576 kg (160 lb), last menstrual period 01/13/2015, SpO2 98 %, unknown if currently breastfeeding. Mild blood pressure elevations yesterday 135/91, but normotensive since then Physical Exam:  General: alert and no distress  Breast: nipples bruised Lochia: appropriate Uterine Fundus: firm/ U-1/ML/NT Perineum intact, no bruising or swelling DVT Evaluation: No evidence of DVT seen on physical exam.   Recent Labs  10/18/15 1721 10/19/15 1759 10/20/15 0608  HGB 13.6  --  11.9*  HCT 40.3  --  35.0  WBC 7.8  --  13.9*  PLT 128* 108* 111*    Assessment/Plan: PPD #1 stable Gestational hypertension: mild blood pressure elevations-normotensive. Monitor over the next 24 hours  After discharge-rto in a few days for BP check Gestational thrombocytopenia-looks like nadir was last night, plt count stable O POS/ RI/ VNI-vaccinate for varicella at discharge Nipple trauma-work with lactation consultant on latch today  Lanolin and gel packs for nipples TDAP UTD Condoms for contraception Dispo: discharge tomorrow Start sitz baths today    LOS: 3 days   Carrie Vasquez 10/21/2015, 8:39 AM

## 2015-10-21 NOTE — Anesthesia Postprocedure Evaluation (Signed)
Anesthesia Post Note  Patient: Carrie Vasquez  Procedure(s) Performed: * No procedures listed *  Patient location during evaluation: Mother Baby Anesthesia Type: Epidural Level of consciousness: awake and alert and oriented Pain management: satisfactory to patient Vital Signs Assessment: post-procedure vital signs reviewed and stable Respiratory status: spontaneous breathing and respiratory function stable Cardiovascular status: stable Postop Assessment: no headache, no backache and no signs of nausea or vomiting Anesthetic complications: no    Last Vitals:  Filed Vitals:   10/21/15 0001 10/21/15 0757  BP: 115/70 121/84  Pulse: 73 70  Temp: 36.5 C 36.8 C  Resp: 20 18    Last Pain:  Filed Vitals:   10/21/15 0810  PainSc: 0-No pain                 Melton KrebsPeralta,  Sicilia Killough R

## 2015-10-22 MED ORDER — VARICELLA VIRUS VACCINE LIVE 1350 PFU/0.5ML IJ SUSR
0.5000 mL | Freq: Once | INTRAMUSCULAR | Status: AC
  Administered 2015-10-22: 0.5 mL via SUBCUTANEOUS
  Filled 2015-10-22: qty 0.5

## 2015-10-22 MED ORDER — OXYCODONE-ACETAMINOPHEN 5-325 MG PO TABS: 1.0000 | ORAL_TABLET | ORAL | Status: DC | PRN

## 2015-10-22 MED ORDER — TRIAMCINOLONE 0.1 % CREAM:EUCERIN CREAM 1:1: 1.0000 "application " | TOPICAL_CREAM | Freq: Two times a day (BID) | CUTANEOUS | Status: DC | PRN

## 2015-10-22 MED ORDER — IBUPROFEN 600 MG PO TABS: 600.0000 mg | ORAL_TABLET | Freq: Four times a day (QID) | ORAL | Status: DC | PRN

## 2015-10-22 NOTE — Discharge Instructions (Signed)
Vaginal Delivery, Care After Refer to this sheet in the next few weeks. These discharge instructions provide you with information on caring for yourself after delivery. Your caregiver may also give you specific instructions. Your treatment has been planned according to the most current medical practices available, but problems sometimes occur. Call your caregiver if you have any problems or questions after you go home. HOME CARE INSTRUCTIONS 1. Take over-the-counter or prescription medicines only as directed by your caregiver or pharmacist. 2. Do not drink alcohol, especially if you are breastfeeding or taking medicine to relieve pain. 3. Do not smoke tobacco. 4. Continue to use good perineal care. Good perineal care includes: 1. Wiping your perineum from back to front 2. Keeping your perineum clean. 3. You can do sitz baths twice a day, to help keep this area clean 5. Do not use tampons, douche or have sex until your caregiver says it is okay. 6. Shower only and avoid sitting in submerged water, aside from sitz baths 7. Wear a well-fitting bra that provides breast support. 8. Eat healthy foods. 9. Drink enough fluids to keep your urine clear or pale yellow. 10. Eat high-fiber foods such as whole grain cereals and breads, brown rice, beans, and fresh fruits and vegetables every day. These foods may help prevent or relieve constipation. 11. Avoid constipation with high fiber foods or medications, such as miralax or metamucil 12. Follow your caregiver's recommendations regarding resumption of activities such as climbing stairs, driving, lifting, exercising, or traveling. 13. Talk to your caregiver about resuming sexual activities. Resumption of sexual activities is dependent upon your risk of infection, your rate of healing, and your comfort and desire to resume sexual activity. 14. Try to have someone help you with your household activities and your newborn for at least a few days after you leave  the hospital. 15. Rest as much as possible. Try to rest or take a nap when your newborn is sleeping. 16. Increase your activities gradually. 17. Keep all of your scheduled postpartum appointments. It is very important to keep your scheduled follow-up appointments. At these appointments, your caregiver will be checking to make sure that you are healing physically and emotionally. SEEK MEDICAL CARE IF:   You are passing large clots from your vagina. Save any clots to show your caregiver.  You have a foul smelling discharge from your vagina.  You have trouble urinating.  You are urinating frequently.  You have pain when you urinate.  You have a change in your bowel movements.  You have increasing redness, pain, or swelling near your vaginal incision (episiotomy) or vaginal tear.  You have pus draining from your episiotomy or vaginal tear.  Your episiotomy or vaginal tear is separating.  You have painful, hard, or reddened breasts.  You have a severe headache.  You have blurred vision or see spots.  You feel sad or depressed.  You have thoughts of hurting yourself or your newborn.  You have questions about your care, the care of your newborn, or medicines.  You are dizzy or light-headed.  You have a rash.  You have nausea or vomiting.  You were breastfeeding and have not had a menstrual period within 12 weeks after you stopped breastfeeding.  You are not breastfeeding and have not had a menstrual period by the 12th week after delivery.  You have a fever. SEEK IMMEDIATE MEDICAL CARE IF:   You have persistent pain.  You have chest pain.  You have shortness of breath.    You faint.  You have leg pain.  You have stomach pain.  Your vaginal bleeding saturates two or more sanitary pads in 1 hour. MAKE SURE YOU:   Understand these instructions.  Will watch your condition.  Will get help right away if you are not doing well or get worse. Document Released:  07/23/2000 Document Revised: 12/10/2013 Document Reviewed: 03/22/2012 ExitCare Patient Information 2015 [employer]. This information is not intended to replace advice given to you by your health care provider. Make sure you discuss any questions you have with your health care provider.  Sitz Bath A sitz bath is a warm water bath taken in the sitting position. The water covers only the hips and butt (buttocks). We recommend using one that fits in the toilet, to help with ease of use and cleanliness. It may be used for either healing or cleaning purposes. Sitz baths are also used to relieve pain, itching, or muscle tightening (spasms). The water may contain medicine. Moist heat will help you heal and relax.  HOME CARE  Take 3 to 4 sitz baths a day. 18. Fill the bathtub half-full with warm water. 19. Sit in the water and open the drain a little. 20. Turn on the warm water to keep the tub half-full. Keep the water running constantly. 21. Soak in the water for 15 to 20 minutes. 22. After the sitz bath, pat the affected area dry. GET HELP RIGHT AWAY IF: You get worse instead of better. Stop the sitz baths if you get worse. MAKE SURE YOU:  Understand these instructions.  Will watch your condition.  Will get help right away if you are not doing well or get worse. Document Released: 09/02/2004 Document Revised: 04/19/2012 Document Reviewed: 11/23/2010 ExitCare Patient Information 2015 [employer]. This information is not intended to replace advice given to you by your health care provider. Make sure you discuss any questions you have with your health care provider.    

## 2015-10-22 NOTE — Progress Notes (Signed)
Patient discharged home with infant and significant other. Discharge instructions, prescriptions and follow up appointment given to and reviewed with patient and significant other. Patient verbalized understanding. Escorted out via wheelchair by auxillary. 

## 2015-10-22 NOTE — Progress Notes (Signed)
Notify [name], Rn of bp 

## 2017-01-19 ENCOUNTER — Encounter: Payer: Self-pay | Admitting: Primary Care

## 2017-01-19 ENCOUNTER — Ambulatory Visit (INDEPENDENT_AMBULATORY_CARE_PROVIDER_SITE_OTHER): Payer: Self-pay | Admitting: Primary Care

## 2017-01-19 DIAGNOSIS — F32A Depression, unspecified: Secondary | ICD-10-CM

## 2017-01-19 DIAGNOSIS — Z0000 Encounter for general adult medical examination without abnormal findings: Secondary | ICD-10-CM | POA: Insufficient documentation

## 2017-01-19 DIAGNOSIS — F329 Major depressive disorder, single episode, unspecified: Secondary | ICD-10-CM

## 2017-01-19 DIAGNOSIS — R21 Rash and other nonspecific skin eruption: Secondary | ICD-10-CM

## 2017-01-19 DIAGNOSIS — F419 Anxiety disorder, unspecified: Secondary | ICD-10-CM

## 2017-01-19 DIAGNOSIS — Z0001 Encounter for general adult medical examination with abnormal findings: Secondary | ICD-10-CM

## 2017-01-19 NOTE — Progress Notes (Signed)
Subjective:    Patient ID: Carrie Vasquez, female    DOB: 09-20-1985, 31 y.o.   MRN: [id number]  HPI  Ms. Carrie Vasquez is a 31 year old female who presents today for complete physical.  1) Possible Periumbilical Hernia: Occurred around May 2017 after the birth of her child. She's noticed a bump that will stick out around the top of her navel. She is able to push the bump back inside. She denies pain, nausea, vomiting. She is concerned the bumps may enlarge with exercise and a subsequent birth.   Immunizations: -Tetanus: Completed in 2017 -Influenza: Did not complete last season   Diet: She endorses a fair diet Breakfast: Fruit, peanut butter toast Lunch: Brown rice, salad, roasted veggies Dinner: Meat, vegetable, starch Snacks: Nuts, raisins Desserts: Daily Beverages: Water, coffee, occasional soda  Exercise: She is not currently exercise Eye exam: Completed years ago Dental exam: Completes annually Pap Smear: Completed in 2016, normal.   Review of Systems  Constitutional: Negative for unexpected weight change.  HENT: Negative for rhinorrhea.   Respiratory: Negative for cough and shortness of breath.   Cardiovascular: Negative for chest pain.  Gastrointestinal: Negative for constipation and diarrhea.  Genitourinary: Negative for difficulty urinating and menstrual problem.  Musculoskeletal: Negative for arthralgias and myalgias.  Skin: Negative for rash.       Intermittent rashes to elbows and knees since March 2017. Rash is itchy. The rash will last 2-3 months.   Allergic/Immunologic: Negative for environmental allergies.  Neurological: Negative for dizziness, numbness and headaches.  Psychiatric/Behavioral:       Some separation anxiety with her baby. Occasional depression with fatigue. Manages well on her own.       Past Medical History:  Diagnosis Date  . Anxiety    does not take medication  . Depression    does not take medication  . Gestational hypertension  10/18/2015  . MRSA (methicillin resistant Staphylococcus aureus)    h/o MRSA = 3-4 years ago = cleared = culture 7/22 negative  . Pregnancy induced hypertension   . Tobacco abuse      Social History   Social History  . Marital status: Unknown    Spouse name: N/A  . Number of children: N/A  . Years of education: N/A   Occupational History  . Not on file.   Social History Main Topics  . Smoking status: Former Smoker    Quit date: 02/14/2015  . Smokeless tobacco: Never Used  . Alcohol use No  . Drug use: No  . Sexual activity: Yes   Other Topics Concern  . Not on file   Social History Narrative   Single. Getting married next May.   Works as a Hospital doctor.   Highest level of education completed GED with some college.   Enjoys working out, Tourist information centre manager.       Past Surgical History:  Procedure Laterality Date  . ADENOIDECTOMY  1996  . APPENDECTOMY  1993  . WISDOM TOOTH EXTRACTION     one removed at age 53    Family History  Problem Relation Age of Onset  . Hypertension Paternal Grandfather   . Diabetes Maternal Grandfather   . Cancer Maternal Grandfather        leukemia  . Epilepsy Brother     Allergies  Allergen Reactions  . Sulfa Antibiotics Hives    No current outpatient prescriptions on file prior to visit.   No current facility-administered medications on file prior to visit.  BP 114/70   Pulse 70   Temp 98.1 F (36.7 C) (Oral)   Ht 5\' 5"  (1.651 m)   Wt 122 lb 6.4 oz (55.5 kg)   LMP 12/16/2016   SpO2 98%   BMI 20.37 kg/m    Objective:   Physical Exam  Constitutional: She is oriented to person, place, and time. She appears well-nourished.  HENT:  Right Ear: Tympanic membrane and ear canal normal.  Left Ear: Tympanic membrane and ear canal normal.  Nose: Nose normal.  Mouth/Throat: Oropharynx is clear and moist.  Eyes: Conjunctivae and EOM are normal. Pupils are equal, round, and reactive to light.  Neck: Neck  supple. No thyromegaly present.  Cardiovascular: Normal rate and regular rhythm.   No murmur heard. Pulmonary/Chest: Effort normal and breath sounds normal. She has no rales.  Abdominal: Soft. Bowel sounds are normal. There is no tenderness.  No abdominal hernia noted on exam.  Musculoskeletal: Normal range of motion.  Lymphadenopathy:    She has no cervical adenopathy.  Neurological: She is alert and oriented to person, place, and time. She has normal reflexes. No cranial nerve deficit.  Skin: Skin is warm and dry. No rash noted.  Psychiatric: She has a normal mood and affect.          Assessment & Plan:

## 2017-01-19 NOTE — Patient Instructions (Signed)
Continue working on a healthy diet.  Start exercising. You should be getting 150 minutes of moderate intensity exercise weekly.  Ensure you are consuming 64 ounces of water daily.  Apply cortisone cream twice daily to the rashes when they appear.   Please come see me if no improvement to the rashes after using cortisone cream.  You are due for your Pap next year.  Follow up in 1 year for your annual exam or sooner if needed.  It was a pleasure to see you today!

## 2017-01-19 NOTE — Assessment & Plan Note (Signed)
She briefly mention separation anxiety and depression with fatigue since the birth of her child. She was offered medication per GYN 1 year ago, she declined at that time. Overall she feels that she manages well on her own. Offered several options for treatment including therapy and/or medication. She will update if her symptoms progress.

## 2017-01-19 NOTE — Assessment & Plan Note (Addendum)
Sounds very similar to psoriasis. No rash on examination today. We'll have her try OTC cortisone cream twice daily when the rash returns. Recommended she schedule an appointment when her rash returns so that I can evaluate. Looks like she's tried triamcinolone 0.1% cream without improvement.

## 2017-01-19 NOTE — Assessment & Plan Note (Signed)
Tdap up-to-date. Pap up-to-date. Due in 2019. Discussed to gradually start exercising with less focused on pressure to her core. Commended her on her healthy diet. Exam unremarkable. She declines labs, this is appropriate. Follow-up in one year for annual exam.

## 2017-02-18 ENCOUNTER — Encounter: Payer: Self-pay | Admitting: Emergency Medicine

## 2017-02-18 ENCOUNTER — Emergency Department: Payer: BLUE CROSS/BLUE SHIELD

## 2017-02-18 ENCOUNTER — Emergency Department
Admission: EM | Admit: 2017-02-18 | Discharge: 2017-02-19 | Disposition: A | Discharge status: Home / Self Care | Payer: BLUE CROSS/BLUE SHIELD | Attending: Emergency Medicine | Admitting: Emergency Medicine

## 2017-02-18 DIAGNOSIS — O468X1 Other antepartum hemorrhage, first trimester: Secondary | ICD-10-CM

## 2017-02-18 DIAGNOSIS — O131 Gestational [pregnancy-induced] hypertension without significant proteinuria, first trimester: Secondary | ICD-10-CM | POA: Insufficient documentation

## 2017-02-18 DIAGNOSIS — O418X11 Other specified disorders of amniotic fluid and membranes, first trimester, fetus 1: Secondary | ICD-10-CM | POA: Diagnosis not present

## 2017-02-18 DIAGNOSIS — Z3A01 Less than 8 weeks gestation of pregnancy: Secondary | ICD-10-CM | POA: Diagnosis not present

## 2017-02-18 DIAGNOSIS — Z87891 Personal history of nicotine dependence: Secondary | ICD-10-CM | POA: Diagnosis not present

## 2017-02-18 DIAGNOSIS — O200 Threatened abortion: Secondary | ICD-10-CM | POA: Diagnosis not present

## 2017-02-18 DIAGNOSIS — O9989 Other specified diseases and conditions complicating pregnancy, childbirth and the puerperium: Secondary | ICD-10-CM | POA: Diagnosis present

## 2017-02-18 DIAGNOSIS — R109 Unspecified abdominal pain: Secondary | ICD-10-CM

## 2017-02-18 DIAGNOSIS — O26891 Other specified pregnancy related conditions, first trimester: Secondary | ICD-10-CM | POA: Diagnosis not present

## 2017-02-18 DIAGNOSIS — O418X10 Other specified disorders of amniotic fluid and membranes, first trimester, not applicable or unspecified: Secondary | ICD-10-CM

## 2017-02-18 DIAGNOSIS — O209 Hemorrhage in early pregnancy, unspecified: Secondary | ICD-10-CM

## 2017-02-18 LAB — ABO/RH: ABO/RH(D): O POS

## 2017-02-18 LAB — HCG, QUANTITATIVE, PREGNANCY: HCG, BETA CHAIN, QUANT, S: 5758 m[IU]/mL — AB (ref <5)

## 2017-02-18 LAB — POCT PREGNANCY, URINE: Preg Test, Ur: POSITIVE — AB

## 2017-02-18 NOTE — ED Notes (Signed)
Patient r/f US 

## 2017-02-18 NOTE — ED Triage Notes (Signed)
Pt c/o vaginal spotting and abdominal cramping starting approximately 1 hr ago. Pt is 8-[redacted] wks pregnant, G2P1A0.

## 2017-02-18 NOTE — ED Notes (Addendum)
Patient @ US

## 2017-02-18 NOTE — ED Triage Notes (Signed)
Pt in via POV, pt states she is approximately [redacted] weeks pregnant, reports sudden onset severe cramping, noticing scant vaginal bleeding when she went to the bathroom.  Pt contacted OB, advised to be seen here.  Vitals WDL, NAD noted at this time.

## 2017-02-18 NOTE — ED Notes (Signed)
Pt reports lower abdominal cramping pain with scant vaginal bleeding, pt reports apporx 2 tablespoons blood since. Pt reports 8-[redacted] weeks pregnant.  Only takes prenatal vitta, last preg with preeclampsia at due date otherwise preg unremarkable.  7916 month old daughter with father att.

## 2017-02-18 NOTE — ED Provider Notes (Signed)
[facility] Emergency Department Provider Note  ____________________________________________  Time seen: Approximately 9:45 PM  I have reviewed the triage vital signs and the nursing notes.   HISTORY  Chief Complaint Threatened Miscarriage   HPI Carrie Vasquez is a 31 y.o. female at approximately [redacted] weeks GA per LMP who presents for evaluation of abdominal cramping and vaginal spotting. Patient has not established care for this pregnancy or had an ultrasound yet. weeks GA per LMP this evening around 7 PM patient started having severe lower abdominal cramping that has been intermittent and nonradiating. She has had vaginal spotting. No clots. No syncope, no dizziness, no nausea or vomiting, no dysuria or hematuria. At this time patient reports that the abdominal pain is mild. Her first pregnancy resulted in a full-term vaginal delivery with no complications.  Past Medical History:  Diagnosis Date  . Anxiety    does not take medication  . Depression    does not take medication  . Gestational hypertension 10/18/2015  . MRSA (methicillin resistant Staphylococcus aureus)    h/o MRSA = 3-4 years ago = cleared = culture 7/22 negative  . Pregnancy induced hypertension   . Tobacco abuse     Patient Active Problem List   Diagnosis Date Noted  . Rash and nonspecific skin eruption 01/19/2017  . Preventative health care 01/19/2017  . Anxiety and depression 01/19/2017  . Gestational hypertension 10/18/2015  . Tobacco abuse 01/20/2015    Past Surgical History:  Procedure Laterality Date  . ADENOIDECTOMY  1996  . APPENDECTOMY  1993  . WISDOM TOOTH EXTRACTION     one removed at age 31    Prior to Admission medications   Not on File    Allergies Sulfa antibiotics  Family History  Problem Relation Age of Onset  . Hypertension Paternal Grandfather   . Diabetes Maternal Grandfather   . Cancer Maternal Grandfather        leukemia  . Epilepsy Brother      Social History Social History  Substance Use Topics  . Smoking status: Former Smoker    Quit date: 02/14/2015  . Smokeless tobacco: Never Used  . Alcohol use No    Review of Systems  Constitutional: Negative for fever. Eyes: Negative for visual changes. ENT: Negative for sore throat. Neck: No neck pain  Cardiovascular: Negative for chest pain. Respiratory: Negative for shortness of breath. Gastrointestinal: + lower cramping abdominal pain. No vomiting or diarrhea. Genitourinary: Negative for dysuria. + vaginal bleeding Musculoskeletal: Negative for back pain. Skin: Negative for rash. Neurological: Negative for headaches, weakness or numbness. Psych: No SI or HI  ____________________________________________   PHYSICAL EXAM:  VITAL SIGNS: ED Triage Vitals  Enc Vitals Group     BP 02/18/17 2009 129/85     Pulse Rate 02/18/17 2009 70     Resp 02/18/17 2009 16     Temp 02/18/17 2009 98.8 F (37.1 C)     Temp Source 02/18/17 2009 Oral     SpO2 02/18/17 2009 100 %     Weight 02/18/17 2010 122 lb (55.3 kg)     Height 02/18/17 2010 5\' 4"  (1.626 m)     Head Circumference --      Peak Flow --      Pain Score 02/18/17 2009 0     Pain Loc --      Pain Edu? --      Excl. in GC? --     Constitutional: Alert and oriented. Well appearing  and in no apparent distress. HEENT:      Head: Normocephalic and atraumatic.         Eyes: Conjunctivae are normal. Sclera is non-icteric.       Mouth/Throat: Mucous membranes are moist.       Neck: Supple with no signs of meningismus. Cardiovascular: Regular rate and rhythm. No murmurs, gallops, or rubs. 2+ symmetrical distal pulses are present in all extremities. No JVD. Respiratory: Normal respiratory effort. Lungs are clear to auscultation bilaterally. No wheezes, crackles, or rhonchi.  Gastrointestinal: Soft, non tender, and non distended with positive bowel sounds. No rebound or guarding. Genitourinary: No CVA  tenderness. Musculoskeletal: Nontender with normal range of motion in all extremities. No edema, cyanosis, or erythema of extremities. Neurologic: Normal speech and language. Face is symmetric. Moving all extremities. No gross focal neurologic deficits are appreciated. Skin: Skin is warm, dry and intact. No rash noted. Psychiatric: Mood and affect are normal. Speech and behavior are normal.  ____________________________________________   LABS (all labs ordered are listed, but only abnormal results are displayed)  Labs Reviewed  HCG, QUANTITATIVE, PREGNANCY - Abnormal; Notable for the following:       Result Value   hCG, Beta Chain, Quant, S 5,758 (*)    All other components within normal limits  POCT PREGNANCY, URINE - Abnormal; Notable for the following:    Preg Test, Ur POSITIVE (*)    All other components within normal limits  POC URINE PREG, ED  ABO/RH   ____________________________________________  EKG  none  ____________________________________________  RADIOLOGY  TVUS: PND  ____________________________________________   PROCEDURES  Procedure(s) performed: None Procedures Critical Care performed:  None ____________________________________________   INITIAL IMPRESSION / ASSESSMENT AND PLAN / ED COURSE  31 y.o. female G2P1 at approximately [redacted] weeks GA per LMP who presents for evaluation of abdominal cramping and vaginal spotting. Patient is well appearing, looks very anxious, her vitals are within normal limits, abdomen is soft with no tenderness throughout. Differential diagnoses including bleeding from first trimester versus miscarriage versus ectopic pregnancy. We'll send patient for transvaginal ultrasound. HCG 5758 which is low for 8 week pregnancy. ABO O+, no indication for Rhogam.  Clinical Course as of Feb 19 2323  Fri Feb 18, 2017  2323 [place] pending. Care transferred to Dr. Zenda Alpers.  [CV]    Clinical Course User Index [CV] Nita Sickle, MD     Pertinent labs & imaging results that were available during my care of the patient were reviewed by me and considered in my medical decision making (see chart for details).    ____________________________________________   FINAL CLINICAL IMPRESSION(S) / ED DIAGNOSES  Final diagnoses:  Abdominal pain during pregnancy in first trimester  Vaginal bleeding in pregnancy, first trimester      NEW MEDICATIONS STARTED DURING THIS VISIT:  New Prescriptions   No medications on file     Note:  This document was prepared using Dragon voice recognition software and may include unintentional dictation errors.    Nita Sickle, MD 02/18/17 [phone]

## 2017-02-18 NOTE — ED Notes (Signed)
Patient transported to Ultrasound 

## 2017-02-19 NOTE — ED Provider Notes (Signed)
-----------------------------------------   12:15 AM on 02/19/2017 -----------------------------------------   Blood pressure 127/75, pulse 68, temperature 98.8 F (37.1 C), temperature source Oral, resp. rate 18, height 5\' 4"  (1.626 m), weight 55.3 kg (122 lb), last menstrual period 12/07/2016, SpO2 100 %, currently breastfeeding.  Assuming care from Dr. Don PerkingVeronese.  In short, Carrie Vasquez is a 31 y.o. female with a chief complaint of Threatened Miscarriage .  Refer to the original H&P for additional details.  The current plan of care is to follow up the results of the US.   Clinical Course as of Feb 19 14  Fri Feb 18, 2017  2323 US pending. Care transferred to Dr. Zenda AlpersWebster.  [CV]  Sat Feb 19, 2017  0015 Single intrauterine gestational sac with an estimated gestational age of [redacted] weeks, 2 days. No fetal pole identified at this time. Follow-up in 7-11 days, or earlier if clinically indicated, recommended.  Moderate size subchorionic hemorrhage.   US OB Comp Less 14 Wks [AW]    Clinical Course User Index [AW] Rebecka ApleyWebster, Allison P, MD [CV] Don PerkingVeronese, [place], MD   It appears that the patient is having a threatened miscarriage. She also has a subchorionic hemorrhage. The patient needs to follow up with OB/GYN for further evaluation of this vaginal bleeding and pregnancy. The patient will be discharged home to follow-up.   Rebecka ApleyWebster, Allison P, MD 02/19/17 [phone]

## 2017-03-01 ENCOUNTER — Encounter: Payer: Self-pay | Admitting: Obstetrics and Gynecology

## 2017-03-01 ENCOUNTER — Ambulatory Visit (INDEPENDENT_AMBULATORY_CARE_PROVIDER_SITE_OTHER): Payer: BLUE CROSS/BLUE SHIELD | Admitting: Obstetrics and Gynecology

## 2017-03-01 VITALS — BP 100/54 | Wt 121.0 lb

## 2017-03-01 DIAGNOSIS — IMO0001 Reserved for inherently not codable concepts without codable children: Secondary | ICD-10-CM

## 2017-03-01 DIAGNOSIS — O418X10 Other specified disorders of amniotic fluid and membranes, first trimester, not applicable or unspecified: Secondary | ICD-10-CM

## 2017-03-01 DIAGNOSIS — O468X1 Other antepartum hemorrhage, first trimester: Secondary | ICD-10-CM

## 2017-03-01 DIAGNOSIS — O200 Threatened abortion: Secondary | ICD-10-CM | POA: Diagnosis not present

## 2017-03-01 NOTE — Progress Notes (Signed)
Obstetric Problem Visit   Chief Complaint:  Chief Complaint  Patient presents with  . NOB    History of Present Illness: Patient is a 31 y.o. G2P1001 [redacted]w[redacted]d presenting for first trimester bleeding.  The onset of bleeding was 2 weeks ago.  She presented to the emergency department at [facility] at that time with ultrasound showing early gestational sac, no fetal pole, and large subchorionic hemorrhage.  Since that time bleeding has subsided but she feels pregnancy symptoms have lessened.  .  Is bleeding equal to or greater than normal menstrual flow:  No Any recent trauma:  No Recent intercourse:  No History of prior miscarriage:  No Prior ultrasound demonstrating IUP:  Yes Prior ultrasound demonstrating viable IUP:  No Prior Serum HCG:  Yes 5758 on 02/18/17 Rh status: O positive  Review of Systems: Review of Systems  Constitutional: Negative for chills, fever and malaise/fatigue.  Gastrointestinal: Negative for abdominal pain and nausea.  Psychiatric/Behavioral: Negative for depression.    Past Medical History:  Past Medical History:  Diagnosis Date  . Anxiety    does not take medication  . Depression    does not take medication  . Gestational hypertension 10/18/2015  . MRSA (methicillin resistant Staphylococcus aureus)    h/o MRSA = 3-4 years ago = cleared = culture 7/22 negative  . Pregnancy induced hypertension   . Tobacco abuse     Past Surgical History:  Past Surgical History:  Procedure Laterality Date  . ADENOIDECTOMY  1996  . APPENDECTOMY  1993  . WISDOM TOOTH EXTRACTION     one removed at age 82    Gynecologic History:  Patient's last menstrual period was 12/16/2016.   Obstetric History: G2P1001  Family History:  Family History  Problem Relation Age of Onset  . Hypertension Paternal Grandfather   . Diabetes Maternal Grandfather   . Cancer Maternal Grandfather        leukemia  . Epilepsy Brother     Social History:  Social History   Social History  .  Marital status: Married    Spouse name: N/A  . Number of children: N/A  . Years of education: N/A   Occupational History  . Not on file.   Social History Main Topics  . Smoking status: Former Smoker    Quit date: 02/14/2015  . Smokeless tobacco: Never Used  . Alcohol use No  . Drug use: No  . Sexual activity: Yes   Other Topics Concern  . Not on file   Social History Narrative   Single. Getting married next May.   Works as a Hospital doctor.   Highest level of education completed GED with some college.   Enjoys working out, Tourist information centre manager.       Allergies:  Allergies  Allergen Reactions  . Sulfa Antibiotics Hives    Medications: Prior to Admission medications   Not on File    Physical Exam Vitals: Blood pressure (!) 100/54, weight 121 lb (54.9 kg), last menstrual period 12/16/2016, currently breastfeeding. General: NAD HEENT: normocephalic, anicteric Pulmonary: No increased work of breathing, Abdomen: NABS, soft, non-tender, non-distended.  Umbilicus without lesions.  No hepatomegaly, splenomegaly or masses palpable. No evidence of hernia  Genitourinary:  External: Normal external female genitalia.  Normal urethral meatus, normal  Bartholin's and Skene's glands.    Vagina: Normal vaginal mucosa, no evidence of prolapse.    Cervix: closed  Uterus:slightly enlarged approximately 8-9 week size  Adnexa: ovaries non-enlarged, no adnexal masses  Rectal:  deferred Extremities: no edema, erythema, or tenderness Neurologic: Grossly intact Psychiatric: mood appropriate, affect full  Assessment: 31 y.o. G2P1001 8545w6d with threatened first trimester misccarieage  Plan: Problem List Items Addressed This Visit    None    Visit Diagnoses    Threatened abortion    -  Primary   Relevant Orders   Beta HCG, Quant   US OB Comp Less 14 Wks   Subchorionic hemorrhage of placenta in first trimester, single or unspecified fetus       Relevant Orders   Beta HCG,  Quant   US OB Comp Less 14 Wks      "Society of [employer]iology[employer] in Ultrasound Guidelines for Transvaginal Ultrasonographic Diagnosis of Early Pregnancy Loss" and adopted in ACOG Practice Bulletin Number 150, May 2015 (reaffirmed 2017) "Early Pregnancy Loss"  - CRL of 7mm or greater and absence of fetal heartbeat  - Mean sac diameter of 25mm or greater and no embryo  - Absence of embryo with a discernable heartbeat 2 week after initial ultrasound showing a gestational sac without a yolk sac  - Absence of embryo with a discernable heartbeat 11 days after initial ultrasound showing a gestational sac with  yolk sac present - now 11 days since last ultrasound so will order repeat ultrasound   1) First trimester bleeding - incidence and clinical course of first trimester bleeding is discussed in detail with the patient today.  Approximately 1/3 of pregnancies ending in live births experienced 1st trimester bleeding.  The amount of bleeding is variable and not necessarily predictive of outcome.  Sources may be cervical or uterine.  Subchorionic hemorrhages are a frequent concurrent findings on ultrasound and are followed expectantly.  These often absorb or regress spontaneously although risk for expansion and further disruption of the utero-placental interface leading to miscarriage is possible.  There is no clearly documented benefit to limiting or modifying activity and sexual intercourse in altering clinic course of 1st trimester bleeding.    2) If no already done will proceed with TVUS evaluation to document viability, and if uncertain viability or absence of a demonstrable IUP (and no previous documentation of IUP) will trend HCG levels.  3) The patient is Rh positive, rhogam is therefore not indicated to decrease the risk rhesus alloimmunization.    4) Routine bleeding precautions were discussed with the patient prior the conclusion of today's visit.   Vena AustriaAndreas Gaelan Glennon, MD, Merlinda FrederickFACOG [facility], [facility]

## 2017-03-01 NOTE — Progress Notes (Signed)
NOB ER on 7/13 bleeding/slight cramping, nothing today

## 2017-03-02 ENCOUNTER — Other Ambulatory Visit: Payer: Self-pay | Admitting: Obstetrics & Gynecology

## 2017-03-02 ENCOUNTER — Ambulatory Visit (INDEPENDENT_AMBULATORY_CARE_PROVIDER_SITE_OTHER): Payer: BLUE CROSS/BLUE SHIELD | Admitting: Obstetrics & Gynecology

## 2017-03-02 ENCOUNTER — Other Ambulatory Visit: Payer: BLUE CROSS/BLUE SHIELD

## 2017-03-02 VITALS — BP 110/70 | Wt 121.0 lb

## 2017-03-02 DIAGNOSIS — O418X10 Other specified disorders of amniotic fluid and membranes, first trimester, not applicable or unspecified: Secondary | ICD-10-CM

## 2017-03-02 DIAGNOSIS — O468X1 Other antepartum hemorrhage, first trimester: Secondary | ICD-10-CM

## 2017-03-02 DIAGNOSIS — O209 Hemorrhage in early pregnancy, unspecified: Secondary | ICD-10-CM

## 2017-03-02 LAB — BETA HCG QUANT (REF LAB): hCG Quant: 42642 m[IU]/mL

## 2017-03-02 NOTE — Progress Notes (Signed)
US done today by self- CRL, yolk sac, and FHTs.  Possible small SCH.    F/u US 2 weeks  TVUS- CRL 0.91 cm, GA [redacted] weeks 6 days, [facility]

## 2017-03-25 ENCOUNTER — Ambulatory Visit (INDEPENDENT_AMBULATORY_CARE_PROVIDER_SITE_OTHER): Payer: 59 | Admitting: Obstetrics and Gynecology

## 2017-03-25 VITALS — BP 109/72 | HR 74 | Ht 64.0 in | Wt 120.2 lb

## 2017-03-25 DIAGNOSIS — Z3481 Encounter for supervision of other normal pregnancy, first trimester: Secondary | ICD-10-CM

## 2017-03-25 DIAGNOSIS — Z1389 Encounter for screening for other disorder: Secondary | ICD-10-CM

## 2017-03-25 DIAGNOSIS — Z113 Encounter for screening for infections with a predominantly sexual mode of transmission: Secondary | ICD-10-CM

## 2017-03-25 DIAGNOSIS — O209 Hemorrhage in early pregnancy, unspecified: Secondary | ICD-10-CM

## 2017-03-25 DIAGNOSIS — N926 Irregular menstruation, unspecified: Secondary | ICD-10-CM

## 2017-03-25 MED ORDER — PRENATE MINI 18-0.6-0.4-350 MG PO CAPS: 1.0000 | ORAL_CAPSULE | Freq: Every day | ORAL | 3 refills | Status: DC

## 2017-03-25 NOTE — Patient Instructions (Signed)
Pregnancy and Zika Virus Disease Zika virus disease, or Zika, is an illness that can spread to people from mosquitoes that carry the virus. It may also spread from person to person through infected body fluids. Zika first occurred in [place], but recently it has spread to new areas. The virus occurs in tropical climates. The location of Zika continues to change. Most people who become infected with Zika virus do not develop serious illness. However, Zika may cause birth defects in an unborn baby whose mother is infected with the virus. It may also increase the risk of miscarriage. What are the symptoms of Zika virus disease? In many cases, people who have been infected with Zika virus do not develop any symptoms. If symptoms appear, they usually start about a week after the person is infected. Symptoms are usually mild. They may include:  Fever.  Rash.  Red eyes.  Joint pain.  How does Zika virus disease spread? The main way that Zika virus spreads is through the bite of a certain type of mosquito. Unlike most types of mosquitos, which bite only at night, the type of mosquito that carries Zika virus bites both at night and during the day. Zika virus can also spread through sexual contact, through a blood transfusion, and from a mother to her baby before or during birth. Once you have had Zika virus disease, it is unlikely that you will get it again. Can I pass Zika to my baby during pregnancy? Yes, Zika can pass from a mother to her baby before or during birth. What problems can Zika cause for my baby? A woman who is infected with Zika virus while pregnant is at risk of having her baby born with a condition in which the brain or head is smaller than expected (microcephaly). Babies who have microcephaly can have developmental delays, seizures, hearing problems, and vision problems. Having Zika virus disease during pregnancy can also increase the risk of miscarriage. How can Zika virus disease be  prevented? There is no vaccine to prevent Zika. The best way to prevent the disease is to avoid infected mosquitoes and avoid exposure to body fluids that can spread the virus. Avoid any possible exposure to Zika by taking the following precautions. For women and their sex partners:  Avoid traveling to high-risk areas. The locations where Zika is being reported change often. To identify high-risk areas, check the CDC travel website: www.cdc.gov/zika/geo/index.html  If you or your sex partner must travel to a high-risk area, talk with a health care provider before and after traveling.  Take all precautions to avoid mosquito bites if you live in, or travel to, any of the high-risk areas. Insect repellents are safe to use during pregnancy.  Ask your health care provider when it is safe to have sexual contact.  For women:  If you are pregnant or trying to become pregnant, avoid sexual contact with persons who may have been exposed to Zika virus, persons who have possible symptoms of Zika, or persons whose history you are unsure about. If you choose to have sexual contact with someone who may have been exposed to Zika virus, use condoms correctly during the entire duration of sexual activity, every time. Do not share sexual devices, as you may be exposed to body fluids.  Ask your health care provider about when it is safe to attempt pregnancy after a possible exposure to Zika virus.  What steps should I take to avoid mosquito bites? Take these steps to avoid mosquito bites   when you are in a high-risk area:  Wear loose clothing that covers your arms and legs.  Limit your outdoor activities.  Do not open windows unless they have window screens.  Sleep under mosquito nets.  Use insect repellent. The best insect repellents have:  DEET, picaridin, oil of lemon eucalyptus (OLE), or IR3535 in them.  Higher amounts of an active ingredient in them.  Remember that insect repellents are safe to  use during pregnancy.  Do not use OLE on children who are younger than 3 years of age. Do not use insect repellent on babies who are younger than 2 months of age.  Cover your child's stroller with mosquito netting. Make sure the netting fits snugly and that any loose netting does not cover your child's mouth or nose. Do not use a blanket as a mosquito-protection cover.  Do not apply insect repellent underneath clothing.  If you are using sunscreen, apply the sunscreen before applying the insect repellent.  Treat clothing with permethrin. Do not apply permethrin directly to your skin. Follow label directions for safe use.  Get rid of standing water, where mosquitoes may reproduce. Standing water is often found in items such as buckets, bowls, animal food dishes, and flowerpots.  When you return from traveling to any high-risk area, continue taking actions to protect yourself against mosquito bites for 3 weeks, even if you show no signs of illness. This will prevent spreading Zika virus to uninfected mosquitoes. What should I know about the sexual transmission of Zika? People can spread Zika to their sexual partners during vaginal, anal, or oral sex, or by sharing sexual devices. Many people with Zika do not develop symptoms, so a person could spread the disease without knowing that they are infected. The greatest risk is to women who are pregnant or who may become pregnant. Zika virus can live longer in semen than it can live in blood. Couples can prevent sexual transmission of the virus by:  Using condoms correctly during the entire duration of sexual activity, every time. This includes vaginal, anal, and oral sex.  Not sharing sexual devices. Sharing increases your risk of being exposed to body fluid from another person.  Avoiding all sexual activity until your health care provider says it is safe.  Should I be tested for Zika virus? A sample of your blood can be tested for Zika virus. A  pregnant woman should be tested if she may have been exposed to the virus or if she has symptoms of Zika. She may also have additional tests done during her pregnancy, such ultrasound testing. Talk with your health care provider about which tests are recommended. This information is not intended to replace advice given to you by your health care provider. Make sure you discuss any questions you have with your health care provider. Document Released: 04/16/2015 Document Revised: 01/01/2016 Document Reviewed: 04/09/2015 Elsevier Interactive Patient Education  2018 [employer] Hyperemesis Gravidarum Hyperemesis gravidarum is a severe form of nausea and vomiting that happens during pregnancy. Hyperemesis is worse than morning sickness. It may cause you to have nausea or vomiting all day for many days. It may keep you from eating and drinking enough food and liquids. Hyperemesis usually occurs during the first half (the first 20 weeks) of pregnancy. It often goes away once a woman is in her second half of pregnancy. However, sometimes hyperemesis continues through an entire pregnancy. What are the causes? The cause of this condition is not known. It may be related   to changes in chemicals (hormones) in the body during pregnancy, such as the high level of pregnancy hormone (human chorionic gonadotropin) or the increase in the female sex hormone (estrogen). What are the signs or symptoms? Symptoms of this condition include:  Severe nausea and vomiting.  Nausea that does not go away.  Vomiting that does not allow you to keep any food down.  Weight loss.  Body fluid loss (dehydration).  Having no desire to eat, or not liking food that you have previously enjoyed.  How is this diagnosed? This condition may be diagnosed based on:  A physical exam.  Your medical history.  Your symptoms.  Blood tests.  Urine tests.  How is this treated? This condition may be managed with medicine. If  medicines to do not help relieve nausea and vomiting, you may need to receive fluids through an IV tube at the hospital. Follow these instructions at home:  Take over-the-counter and prescription medicines only as told by your health care provider.  Avoid iron pills and multivitamins that contain iron for the first 3-4 months of pregnancy. If you take prescription iron pills, do not stop taking them unless your health care provider approves.  Take the following actions to help prevent nausea and vomiting: ? In the morning, before getting out of bed, try eating a couple of dry crackers or a piece of toast. ? Avoid foods and smells that upset your stomach. Fatty and spicy foods may make nausea worse. ? Eat 5-6 small meals a day. ? Do not drink fluids while eating meals. Drink between meals. ? Eat or suck on things that have ginger in them. Ginger can help relieve nausea. ? Avoid food preparation. The smell of food can spoil your appetite or trigger nausea.  Follow instructions from your health care provider about eating or drinking restrictions.  For snacks, eat high-protein foods, such as cheese.  Keep all follow-up and pre-birth (prenatal) visits as told by your health care provider. This is important. Contact a health care provider if:  You have pain in your abdomen.  You have a severe headache.  You have vision problems.  You are losing weight. Get help right away if:  You cannot drink fluids without vomiting.  You vomit blood.  You have constant nausea and vomiting.  You are very weak.  You are very thirsty.  You feel dizzy.  You faint.  You have a fever or other symptoms that last for more than 2-3 days.  You have a fever and your symptoms suddenly get worse. Summary  Hyperemesis gravidarum is a severe form of nausea and vomiting that happens during pregnancy.  Making some changes to your eating habits may help relieve nausea and vomiting.  This condition may  be managed with medicine.  If medicines to do not help relieve nausea and vomiting, you may need to receive fluids through an IV tube at the hospital. This information is not intended to replace advice given to you by your health care provider. Make sure you discuss any questions you have with your health care provider. Document Released: 07/26/2005 Document Revised: 03/24/2016 Document Reviewed: 03/24/2016 Elsevier Interactive Patient Education  2017 [employer] First Trimester of Pregnancy The first trimester of pregnancy is from week 1 until the end of week 13 (months 1 through 3). During this time, your baby will begin to develop inside you. At 6-8 weeks, the eyes and face are formed, and the heartbeat can be seen on ultrasound. At the   end of 12 weeks, all the baby's organs are formed. Prenatal care is all the medical care you receive before the birth of your baby. Make sure you get good prenatal care and follow all of your doctor's instructions. Follow these instructions at home: Medicines  Take over-the-counter and prescription medicines only as told by your doctor. Some medicines are safe and some medicines are not safe during pregnancy.  Take a prenatal vitamin that contains at least 600 micrograms (mcg) of folic acid.  If you have trouble pooping (constipation), take medicine that will make your stool soft (stool softener) if your doctor approves. Eating and drinking  Eat regular, healthy meals.  Your doctor will tell you the amount of weight gain that is right for you.  Avoid raw meat and uncooked cheese.  If you feel sick to your stomach (nauseous) or throw up (vomit): ? Eat 4 or 5 small meals a day instead of 3 large meals. ? Try eating a few soda crackers. ? Drink liquids between meals instead of during meals.  To prevent constipation: ? Eat foods that are high in fiber, like fresh fruits and vegetables, whole grains, and beans. ? Drink enough fluids to keep your pee  (urine) clear or pale yellow. Activity  Exercise only as told by your doctor. Stop exercising if you have cramps or pain in your lower belly (abdomen) or low back.  Do not exercise if it is too hot, too humid, or if you are in a place of great height (high altitude).  Try to avoid standing for long periods of time. Move your legs often if you must stand in one place for a long time.  Avoid heavy lifting.  Wear low-heeled shoes. Sit and stand up straight.  You can have sex unless your doctor tells you not to. Relieving pain and discomfort  Wear a good support bra if your breasts are sore.  Take warm water baths (sitz baths) to soothe pain or discomfort caused by hemorrhoids. Use hemorrhoid cream if your doctor says it is okay.  Rest with your legs raised if you have leg cramps or low back pain.  If you have puffy, bulging veins (varicose veins) in your legs: ? Wear support hose or compression stockings as told by your doctor. ? Raise (elevate) your feet for 15 minutes, 3-4 times a day. ? Limit salt in your food. Prenatal care  Schedule your prenatal visits by the twelfth week of pregnancy.  Write down your questions. Take them to your prenatal visits.  Keep all your prenatal visits as told by your doctor. This is important. Safety  Wear your seat belt at all times when driving.  Make a list of emergency phone numbers. The list should include numbers for family, friends, the hospital, and police and fire departments. General instructions  Ask your doctor for a referral to a local prenatal class. Begin classes no later than at the start of month 6 of your pregnancy.  Ask for help if you need counseling or if you need help with nutrition. Your doctor can give you advice or tell you where to go for help.  Do not use hot tubs, steam rooms, or saunas.  Do not douche or use tampons or scented sanitary pads.  Do not cross your legs for long periods of time.  Avoid all herbs  and alcohol. Avoid drugs that are not approved by your doctor.  Do not use any tobacco products, including cigarettes, chewing tobacco, and electronic cigarettes.   If you need help quitting, ask your doctor. You may get counseling or other support to help you quit.  Avoid cat litter boxes and soil used by cats. These carry germs that can cause birth defects in the baby and can cause a loss of your baby (miscarriage) or stillbirth.  Visit your dentist. At home, brush your teeth with a soft toothbrush. Be gentle when you floss. Contact a doctor if:  You are dizzy.  You have mild cramps or pressure in your lower belly.  You have a nagging pain in your belly area.  You continue to feel sick to your stomach, you throw up, or you have watery poop (diarrhea).  You have a bad smelling fluid coming from your vagina.  You have pain when you pee (urinate).  You have increased puffiness (swelling) in your face, hands, legs, or ankles. Get help right away if:  You have a fever.  You are leaking fluid from your vagina.  You have spotting or bleeding from your vagina.  You have very bad belly cramping or pain.  You gain or lose weight rapidly.  You throw up blood. It may look like coffee grounds.  You are around people who have German measles, fifth disease, or chickenpox.  You have a very bad headache.  You have shortness of breath.  You have any kind of trauma, such as from a fall or a car accident. Summary  The first trimester of pregnancy is from week 1 until the end of week 13 (months 1 through 3).  To take care of yourself and your unborn baby, you will need to eat healthy meals, take medicines only if your doctor tells you to do so, and do activities that are safe for you and your baby.  Keep all follow-up visits as told by your doctor. This is important as your doctor will have to ensure that your baby is healthy and growing well. This information is not intended to replace  advice given to you by your health care provider. Make sure you discuss any questions you have with your health care provider. Document Released: 01/12/2008 Document Revised: 08/03/2016 Document Reviewed: 08/03/2016 Elsevier Interactive Patient Education  2017 [employer] Commonly Asked Questions During Pregnancy  Cats: A parasite can be excreted in cat feces.  To avoid exposure you need to have another person empty the little box.  If you must empty the litter box you will need to wear gloves.  Wash your hands after handling your cat.  This parasite can also be found in raw or undercooked meat so this should also be avoided.  Colds, Sore Throats, Flu: Please check your medication sheet to see what you can take for symptoms.  If your symptoms are unrelieved by these medications please call the office.  Dental Work: Most any dental work your dentist recommends is permitted.  X-rays should only be taken during the first trimester if absolutely necessary.  Your abdomen should be shielded with a lead apron during all x-rays.  Please notify your provider prior to receiving any x-rays.  Novocaine is fine; gas is not recommended.  If your dentist requires a note from us prior to dental work please call the office and we will provide one for you.  Exercise: Exercise is an important part of staying healthy during your pregnancy.  You may continue most exercises you were accustomed to prior to pregnancy.  Later in your pregnancy you will most likely notice you have difficulty with activities   requiring balance like riding a bicycle.  It is important that you listen to your body and avoid activities that put you at a higher risk of falling.  Adequate rest and staying well hydrated are a must!  If you have questions about the safety of specific activities ask your provider.    Exposure to Children with illness: Try to avoid obvious exposure; report any symptoms to us when noted,  If you have chicken pos, red  measles or mumps, you should be immune to these diseases.   Please do not take any vaccines while pregnant unless you have checked with your OB provider.  Fetal Movement: After 28 weeks we recommend you do "kick counts" twice daily.  Lie or sit down in a calm quiet environment and count your baby movements "kicks".  You should feel your baby at least 10 times per hour.  If you have not felt 10 kicks within the first hour get up, walk around and have something sweet to eat or drink then repeat for an additional hour.  If count remains less than 10 per hour notify your provider.  Fumigating: Follow your pest control agent's advice as to how long to stay out of your home.  Ventilate the area well before re-entering.  Hemorrhoids:   Most over-the-counter preparations can be used during pregnancy.  Check your medication to see what is safe to use.  It is important to use a stool softener or fiber in your diet and to drink lots of liquids.  If hemorrhoids seem to be getting worse please call the office.   Hot Tubs:  Hot tubs Jacuzzis and saunas are not recommended while pregnant.  These increase your internal body temperature and should be avoided.  Intercourse:  Sexual intercourse is safe during pregnancy as long as you are comfortable, unless otherwise advised by your provider.  Spotting may occur after intercourse; report any bright red bleeding that is heavier than spotting.  Labor:  If you know that you are in labor, please go to the hospital.  If you are unsure, please call the office and let us help you decide what to do.  Lifting, straining, etc:  If your job requires heavy lifting or straining please check with your provider for any limitations.  Generally, you should not lift items heavier than that you can lift simply with your hands and arms (no back muscles)  Painting:  Paint fumes do not harm your pregnancy, but may make you ill and should be avoided if possible.  Latex or water based paints  have less odor than oils.  Use adequate ventilation while painting.  Permanents & Hair Color:  Chemicals in hair dyes are not recommended as they cause increase hair dryness which can increase hair loss during pregnancy.  " Highlighting" and permanents are allowed.  Dye may be absorbed differently and permanents may not hold as well during pregnancy.  Sunbathing:  Use a sunscreen, as skin burns easily during pregnancy.  Drink plenty of fluids; avoid over heating.  Tanning Beds:  Because their possible side effects are still unknown, tanning beds are not recommended.  Ultrasound Scans:  Routine ultrasounds are performed at approximately 20 weeks.  You will be able to see your baby's general anatomy an if you would like to know the gender this can usually be determined as well.  If it is questionable when you conceived you may also receive an ultrasound early in your pregnancy for dating purposes.  Otherwise ultrasound exams   are not routinely performed unless there is a medical necessity.  Although you can request a scan we ask that you pay for it when conducted because insurance does not cover " patient request" scans.  Work: If your pregnancy proceeds without complications you may work until your due date, unless your physician or employer advises otherwise.  Round Ligament Pain/Pelvic Discomfort:  Sharp, shooting pains not associated with bleeding are fairly common, usually occurring in the second trimester of pregnancy.  They tend to be worse when standing up or when you remain standing for long periods of time.  These are the result of pressure of certain pelvic ligaments called "round ligaments".  Rest, Tylenol and heat seem to be the most effective relief.  As the womb and fetus grow, they rise out of the pelvis and the discomfort improves.  Please notify the office if your pain seems different than that described.  It may represent a more serious condition.   

## 2017-03-25 NOTE — Progress Notes (Signed)
Cristy Friedlander presents for NOB nurse interview visit. Pregnancy confirmation done at [facility]. Had been previously in ED for first trimester bleeding. Ultrasound on 03/02/2017 per Dr. Tiburcio Pea, EGA: 6.6wk, possible small [facility]. A follow up ultrasound has been ordered.  G-2.  P-1001. Pregnancy education material explained and given. No cats in the home. NOB labs ordered. No ABO/RH labs, she had this done already.  HIV labs and Drug screen were explained optional and she did not decline. Drug screen ordered. PNV encouraged. Genetic screening options discussed. Genetic testing: Unsure.  Pt may discuss with provider. Pt. To follow up with provider in 2 weeks for NOB physical.  All questions answered.

## 2017-03-26 ENCOUNTER — Other Ambulatory Visit: Payer: Self-pay | Admitting: Obstetrics and Gynecology

## 2017-03-26 DIAGNOSIS — O09899 Supervision of other high risk pregnancies, unspecified trimester: Secondary | ICD-10-CM | POA: Insufficient documentation

## 2017-03-26 DIAGNOSIS — Z2839 Other underimmunization status: Secondary | ICD-10-CM | POA: Insufficient documentation

## 2017-03-26 DIAGNOSIS — Z283 Underimmunization status: Principal | ICD-10-CM

## 2017-03-26 LAB — CBC WITH DIFFERENTIAL/PLATELET
BASOS: 0 %
Basophils Absolute: 0 10*3/uL (ref 0.0–0.2)
EOS (ABSOLUTE): 0.1 10*3/uL (ref 0.0–0.4)
EOS: 1 %
HEMATOCRIT: 40.6 % (ref 34.0–46.6)
HEMOGLOBIN: 13.6 g/dL (ref 11.1–15.9)
IMMATURE GRANULOCYTES: 0 %
Immature Grans (Abs): 0 10*3/uL (ref 0.0–0.1)
LYMPHS ABS: 1.4 10*3/uL (ref 0.7–3.1)
Lymphs: 21 %
MCH: 30 pg (ref 26.6–33.0)
MCHC: 33.5 g/dL (ref 31.5–35.7)
MCV: 90 fL (ref 79–97)
MONOCYTES: 7 %
Monocytes Absolute: 0.5 10*3/uL (ref 0.1–0.9)
Neutrophils Absolute: 4.9 10*3/uL (ref 1.4–7.0)
Neutrophils: 71 %
Platelets: 196 10*3/uL (ref 150–379)
RBC: 4.53 x10E6/uL (ref 3.77–5.28)
RDW: 13.4 % (ref 12.3–15.4)
WBC: 6.9 10*3/uL (ref 3.4–10.8)

## 2017-03-26 LAB — HIV ANTIBODY (ROUTINE TESTING W REFLEX): HIV SCREEN 4TH GENERATION: NONREACTIVE

## 2017-03-26 LAB — HEPATITIS B SURFACE ANTIGEN: Hepatitis B Surface Ag: NEGATIVE

## 2017-03-26 LAB — RUBELLA SCREEN: Rubella Antibodies, IGG: 2.11 index (ref >0.99)

## 2017-03-26 LAB — RPR: RPR: NONREACTIVE

## 2017-03-26 LAB — VARICELLA ZOSTER ANTIBODY, IGG

## 2017-03-26 LAB — ANTIBODY SCREEN: ANTIBODY SCREEN: NEGATIVE

## 2017-03-27 LAB — CULTURE, OB URINE

## 2017-03-27 LAB — URINE CULTURE, OB REFLEX

## 2017-03-28 LAB — MONITOR DRUG PROFILE 14(MW)
Amphetamine Scrn, Ur: NEGATIVE ng/mL
BARBITURATE SCREEN URINE: NEGATIVE ng/mL
BENZODIAZEPINE SCREEN, URINE: NEGATIVE ng/mL
BUPRENORPHINE, URINE: NEGATIVE ng/mL
CANNABINOIDS UR QL SCN: NEGATIVE ng/mL
COCAINE(METAB.)SCREEN, URINE: NEGATIVE ng/mL
CREATININE(CRT), U: 118.5 mg/dL (ref 20.0–300.0)
Fentanyl, Urine: NEGATIVE pg/mL
METHADONE SCREEN, URINE: NEGATIVE ng/mL
Meperidine Screen, Urine: NEGATIVE ng/mL
OXYCODONE+OXYMORPHONE UR QL SCN: NEGATIVE ng/mL
Opiate Scrn, Ur: NEGATIVE ng/mL
PH UR, DRUG SCRN: 6 (ref 4.5–8.9)
PHENCYCLIDINE QUANTITATIVE URINE: NEGATIVE ng/mL
Propoxyphene Scrn, Ur: NEGATIVE ng/mL
SPECIFIC GRAVITY: 1.022
Tramadol Screen, Urine: NEGATIVE ng/mL

## 2017-03-28 LAB — URINALYSIS, ROUTINE W REFLEX MICROSCOPIC
BILIRUBIN UA: NEGATIVE
Glucose, UA: NEGATIVE
KETONES UA: NEGATIVE
LEUKOCYTES UA: NEGATIVE
Nitrite, UA: NEGATIVE
PH UA: 6 (ref 5.0–7.5)
Protein, UA: NEGATIVE
RBC UA: NEGATIVE
SPEC GRAV UA: 1.023 (ref 1.005–1.030)
Urobilinogen, Ur: 0.2 mg/dL (ref 0.2–1.0)

## 2017-03-28 LAB — NICOTINE SCREEN, URINE: COTININE UR QL SCN: NEGATIVE ng/mL

## 2017-03-29 ENCOUNTER — Ambulatory Visit (INDEPENDENT_AMBULATORY_CARE_PROVIDER_SITE_OTHER): Payer: 59

## 2017-03-29 DIAGNOSIS — O209 Hemorrhage in early pregnancy, unspecified: Secondary | ICD-10-CM

## 2017-03-29 DIAGNOSIS — N926 Irregular menstruation, unspecified: Secondary | ICD-10-CM

## 2017-03-29 LAB — GC/CHLAMYDIA PROBE AMP
Chlamydia trachomatis, NAA: NEGATIVE
NEISSERIA GONORRHOEAE BY PCR: NEGATIVE

## 2017-04-08 ENCOUNTER — Ambulatory Visit (INDEPENDENT_AMBULATORY_CARE_PROVIDER_SITE_OTHER): Payer: 59 | Admitting: Certified Nurse Midwife

## 2017-04-08 VITALS — BP 108/72 | HR 73 | Wt 119.6 lb

## 2017-04-08 DIAGNOSIS — Z3482 Encounter for supervision of other normal pregnancy, second trimester: Secondary | ICD-10-CM

## 2017-04-08 DIAGNOSIS — Z8659 Personal history of other mental and behavioral disorders: Secondary | ICD-10-CM

## 2017-04-08 DIAGNOSIS — O9989 Other specified diseases and conditions complicating pregnancy, childbirth and the puerperium: Secondary | ICD-10-CM

## 2017-04-08 DIAGNOSIS — Z8759 Personal history of other complications of pregnancy, childbirth and the puerperium: Secondary | ICD-10-CM

## 2017-04-08 DIAGNOSIS — O99891 Other specified diseases and conditions complicating pregnancy: Secondary | ICD-10-CM

## 2017-04-08 LAB — POCT URINALYSIS DIPSTICK
BILIRUBIN UA: NEGATIVE
Glucose, UA: NEGATIVE
Ketones, UA: NEGATIVE
NITRITE UA: NEGATIVE
PH UA: 6 (ref 5.0–8.0)
Protein, UA: NEGATIVE
UROBILINOGEN UA: 0.2 U/dL

## 2017-04-08 MED ORDER — SERTRALINE HCL 50 MG PO TABS: 50.0000 mg | ORAL_TABLET | Freq: Every day | ORAL | 0 refills | Status: DC

## 2017-04-08 MED ORDER — ASPIRIN EC 81 MG PO TBEC: 81.0000 mg | DELAYED_RELEASE_TABLET | Freq: Every day | ORAL | 2 refills | Status: DC

## 2017-04-08 NOTE — Patient Instructions (Signed)
Hypertension During Pregnancy Hypertension is also called high blood pressure. High blood pressure means that the force of your blood moving in your body is too strong. When you are pregnant, this condition should be watched carefully. It can cause problems for you and your baby. Follow these instructions at home: Eating and drinking  Drink enough fluid to keep your pee (urine) clear or pale yellow.  Eat healthy foods that are low in salt (sodium). ? Do not add salt to your food. ? Check labels on foods and drinks to see much salt is in them. Look on the label where you see "Sodium." Lifestyle  Do not use any products that contain nicotine or tobacco, such as cigarettes and e-cigarettes. If you need help quitting, ask your doctor.  Do not use alcohol.  Avoid caffeine.  Avoid stress. Rest and get plenty of sleep. General instructions  Take over-the-counter and prescription medicines only as told by your doctor.  While lying down, lie on your left side. This keeps pressure off your baby.  While sitting or lying down, raise (elevate) your feet. Try putting some pillows under your lower legs.  Exercise regularly. Ask your doctor what kinds of exercise are best for you.  Keep all prenatal and follow-up visits as told by your doctor. This is important. Contact a doctor if:  You have symptoms that your doctor told you to watch for, such as: ? Fever. ? Throwing up (vomiting). ? Headache. Get help right away if:  You have very bad pain in your belly (abdomen).  You are throwing up, and this does not get better with treatment.  You suddenly get swelling in your hands, ankles, or face.  You gain 4 lb (1.8 kg) or more in 1 week.  You get bleeding from your vagina.  You have blood in your pee.  You do not feel your baby moving as much as normal.  You have a change in vision.  You have muscle twitching or sudden tightening (spasms).  You have trouble breathing.  Your lips  or fingernails turn blue. This information is not intended to replace advice given to you by your health care provider. Make sure you discuss any questions you have with your health care provider. Document Released: 08/28/2010 Document Revised: 04/06/2016 Document Reviewed: 04/06/2016 Elsevier Interactive Patient Education  2017 [employer] Sertraline tablets What is this medicine? SERTRALINE (SER tra leen) is used to treat depression. It may also be used to treat obsessive compulsive disorder, panic disorder, post-trauma stress, premenstrual dysphoric disorder (PMDD) or social anxiety. This medicine may be used for other purposes; ask your health care provider or pharmacist if you have questions. COMMON BRAND NAME(S): Zoloft What should I tell my health care provider before I take this medicine? They need to know if you have any of these conditions: -bleeding disorders -bipolar disorder or a family history of bipolar disorder -glaucoma -heart disease -high blood pressure -history of irregular heartbeat -history of low levels of calcium, magnesium, or potassium in the blood -if you often drink alcohol -liver disease -receiving electroconvulsive therapy -seizures -suicidal thoughts, plans, or attempt; a previous suicide attempt by you or a family member -take medicines that treat or prevent blood clots -thyroid disease -an unusual or allergic reaction to sertraline, other medicines, foods, dyes, or preservatives -pregnant or trying to get pregnant -breast-feeding How should I use this medicine? Take this medicine by mouth with a glass of water. Follow the directions on the prescription label. You can  take it with or without food. Take your medicine at regular intervals. Do not take your medicine more often than directed. Do not stop taking this medicine suddenly except upon the advice of your doctor. Stopping this medicine too quickly may cause serious side effects or your condition  may worsen. A special MedGuide will be given to you by the pharmacist with each prescription and refill. Be sure to read this information carefully each time. Talk to your pediatrician regarding the use of this medicine in children. While this drug may be prescribed for children as young as 7 years for selected conditions, precautions do apply. Overdosage: If you think you have taken too much of this medicine contact a poison control center or emergency room at once. NOTE: This medicine is only for you. Do not share this medicine with others. What if I miss a dose? If you miss a dose, take it as soon as you can. If it is almost time for your next dose, take only that dose. Do not take double or extra doses. What may interact with this medicine? Do not take this medicine with any of the following medications: -cisapride -dofetilide -dronedarone -linezolid -MAOIs like Carbex, Eldepryl, Marplan, Nardil, and Parnate -methylene blue (injected into a vein) -pimozide -thioridazine This medicine may also interact with the following medications: -alcohol -amphetamines -aspirin and aspirin-like medicines -certain medicines for depression, anxiety, or psychotic disturbances -certain medicines for fungal infections like ketoconazole, fluconazole, posaconazole, and itraconazole -certain medicines for irregular heart beat like flecainide, quinidine, propafenone -certain medicines for migraine headaches like almotriptan, eletriptan, frovatriptan, naratriptan, rizatriptan, sumatriptan, zolmitriptan -certain medicines for sleep -certain medicines for seizures like carbamazepine, valproic acid, phenytoin -certain medicines that treat or prevent blood clots like warfarin, enoxaparin, dalteparin -cimetidine -digoxin -diuretics -fentanyl -isoniazid -lithium -NSAIDs, medicines for pain and inflammation, like ibuprofen or naproxen -other medicines that prolong the QT interval (cause an abnormal heart  rhythm) -rasagiline -safinamide -supplements like St. John's wort, kava kava, valerian -tolbutamide -tramadol -tryptophan This list may not describe all possible interactions. Give your health care provider a list of all the medicines, herbs, non-prescription drugs, or dietary supplements you use. Also tell them if you smoke, drink alcohol, or use illegal drugs. Some items may interact with your medicine. What should I watch for while using this medicine? Tell your doctor if your symptoms do not get better or if they get worse. Visit your doctor or health care professional for regular checks on your progress. Because it may take several weeks to see the full effects of this medicine, it is important to continue your treatment as prescribed by your doctor. Patients and their families should watch out for new or worsening thoughts of suicide or depression. Also watch out for sudden changes in feelings such as feeling anxious, agitated, panicky, irritable, hostile, aggressive, impulsive, severely restless, overly excited and hyperactive, or not being able to sleep. If this happens, especially at the beginning of treatment or after a change in dose, call your health care professional. Bonita Quin may get drowsy or dizzy. Do not drive, use machinery, or do anything that needs mental alertness until you know how this medicine affects you. Do not stand or sit up quickly, especially if you are an older patient. This reduces the risk of dizzy or fainting spells. Alcohol may interfere with the effect of this medicine. Avoid alcoholic drinks. Your mouth may get dry. Chewing sugarless gum or sucking hard candy, and drinking plenty of water may help. Contact your doctor  if the problem does not go away or is severe. What side effects may I notice from receiving this medicine? Side effects that you should report to your doctor or health care professional as soon as possible: -allergic reactions like skin rash, itching or  hives, swelling of the face, lips, or tongue -anxious -black, tarry stools -changes in vision -confusion -elevated mood, decreased need for sleep, racing thoughts, impulsive behavior -eye pain -fast, irregular heartbeat -feeling faint or lightheaded, falls -feeling agitated, angry, or irritable -hallucination, loss of contact with reality -loss of balance or coordination -loss of memory -painful or prolonged erections -restlessness, pacing, inability to keep still -seizures -stiff muscles -suicidal thoughts or other mood changes -trouble sleeping -unusual bleeding or bruising -unusually weak or tired -vomiting Side effects that usually do not require medical attention (report to your doctor or health care professional if they continue or are bothersome): -change in appetite or weight -change in sex drive or performance -diarrhea -increased sweating -indigestion, nausea -tremors This list may not describe all possible side effects. Call your doctor for medical advice about side effects. You may report side effects to FDA at 1-800-FDA-1088. Where should I keep my medicine? Keep out of the reach of children. Store at room temperature between 15 and 30 degrees C (59 and 86 degrees F). Throw away any unused medicine after the expiration date. NOTE: This sheet is a summary. It may not cover all possible information. If you have questions about this medicine, talk to your doctor, pharmacist, or health care provider.  2018 Elsevier/Gold Standard (2016-07-30 14:17:49) Second Trimester of Pregnancy The second trimester is from week 14 through week 27 (months 4 through 6). The second trimester is often a time when you feel your best. Your body has adjusted to being pregnant, and you begin to feel better physically. Usually, morning sickness has lessened or quit completely, you may have more energy, and you may have an increase in appetite. The second trimester is also a time when the fetus  is growing rapidly. At the end of the sixth month, the fetus is about 9 inches long and weighs about 1 pounds. You will likely begin to feel the baby move (quickening) between 16 and 20 weeks of pregnancy. Body changes during your second trimester Your body continues to go through many changes during your second trimester. The changes vary from woman to woman.  Your weight will continue to increase. You will notice your lower abdomen bulging out.  You may begin to get stretch marks on your hips, abdomen, and breasts.  You may develop headaches that can be relieved by medicines. The medicines should be approved by your health care provider.  You may urinate more often because the fetus is pressing on your bladder.  You may develop or continue to have heartburn as a result of your pregnancy.  You may develop constipation because certain hormones are causing the muscles that push waste through your intestines to slow down.  You may develop hemorrhoids or swollen, bulging veins (varicose veins).  You may have back pain. This is caused by: ? Weight gain. ? Pregnancy hormones that are relaxing the joints in your pelvis. ? A shift in weight and the muscles that support your balance.  Your breasts will continue to grow and they will continue to become tender.  Your gums may bleed and may be sensitive to brushing and flossing.  Dark spots or blotches (chloasma, mask of pregnancy) may develop on your face. This will likely  fade after the baby is born.  A dark line from your belly button to the pubic area (linea nigra) may appear. This will likely fade after the baby is born.  You may have changes in your hair. These can include thickening of your hair, rapid growth, and changes in texture. Some women also have hair loss during or after pregnancy, or hair that feels dry or thin. Your hair will most likely return to normal after your baby is born.  What to expect at prenatal visits During a  routine prenatal visit:  You will be weighed to make sure you and the fetus are growing normally.  Your blood pressure will be taken.  Your abdomen will be measured to track your baby's growth.  The fetal heartbeat will be listened to.  Any test results from the previous visit will be discussed.  Your health care provider may ask you:  How you are feeling.  If you are feeling the baby move.  If you have had any abnormal symptoms, such as leaking fluid, bleeding, severe headaches, or abdominal cramping.  If you are using any tobacco products, including cigarettes, chewing tobacco, and electronic cigarettes.  If you have any questions.  Other tests that may be performed during your second trimester include:  Blood tests that check for: ? Low iron levels (anemia). ? High blood sugar that affects pregnant women (gestational diabetes) between 2724 and 28 weeks. ? Rh antibodies. This is to check for a protein on red blood cells (Rh factor).  Urine tests to check for infections, diabetes, or protein in the urine.  An ultrasound to confirm the proper growth and development of the baby.  An amniocentesis to check for possible genetic problems.  Fetal screens for spina bifida and Down syndrome.  HIV (human immunodeficiency virus) testing. Routine prenatal testing includes screening for HIV, unless you choose not to have this test.  Follow these instructions at home: Medicines  Follow your health care provider's instructions regarding medicine use. Specific medicines may be either safe or unsafe to take during pregnancy.  Take a prenatal vitamin that contains at least 600 micrograms (mcg) of folic acid.  If you develop constipation, try taking a stool softener if your health care provider approves. Eating and drinking  Eat a balanced diet that includes fresh fruits and vegetables, whole grains, good sources of protein such as meat, eggs, or tofu, and low-fat dairy. Your health  care provider will help you determine the amount of weight gain that is right for you.  Avoid raw meat and uncooked cheese. These carry germs that can cause birth defects in the baby.  If you have low calcium intake from food, talk to your health care provider about whether you should take a daily calcium supplement.  Limit foods that are high in fat and processed sugars, such as fried and sweet foods.  To prevent constipation: ? Drink enough fluid to keep your urine clear or pale yellow. ? Eat foods that are high in fiber, such as fresh fruits and vegetables, whole grains, and beans. Activity  Exercise only as directed by your health care provider. Most women can continue their usual exercise routine during pregnancy. Try to exercise for 30 minutes at least 5 days a week. Stop exercising if you experience uterine contractions.  Avoid heavy lifting, wear low heel shoes, and practice good posture.  A sexual relationship may be continued unless your health care provider directs you otherwise. Relieving pain and discomfort  Wear  a good support bra to prevent discomfort from breast tenderness.  Take warm sitz baths to soothe any pain or discomfort caused by hemorrhoids. Use hemorrhoid cream if your health care provider approves.  Rest with your legs elevated if you have leg cramps or low back pain.  If you develop varicose veins, wear support hose. Elevate your feet for 15 minutes, 3-4 times a day. Limit salt in your diet. Prenatal Care  Write down your questions. Take them to your prenatal visits.  Keep all your prenatal visits as told by your health care provider. This is important. Safety  Wear your seat belt at all times when driving.  Make a list of emergency phone numbers, including numbers for family, friends, the hospital, and police and fire departments. General instructions  Ask your health care provider for a referral to a local prenatal education class. Begin classes no  later than the beginning of month 6 of your pregnancy.  Ask for help if you have counseling or nutritional needs during pregnancy. Your health care provider can offer advice or refer you to specialists for help with various needs.  Do not use hot tubs, steam rooms, or saunas.  Do not douche or use tampons or scented sanitary pads.  Do not cross your legs for long periods of time.  Avoid cat litter boxes and soil used by cats. These carry germs that can cause birth defects in the baby and possibly loss of the fetus by miscarriage or stillbirth.  Avoid all smoking, herbs, alcohol, and unprescribed drugs. Chemicals in these products can affect the formation and growth of the baby.  Do not use any products that contain nicotine or tobacco, such as cigarettes and e-cigarettes. If you need help quitting, ask your health care provider.  Visit your dentist if you have not gone yet during your pregnancy. Use a soft toothbrush to brush your teeth and be gentle when you floss. Contact a health care provider if:  You have dizziness.  You have mild pelvic cramps, pelvic pressure, or nagging pain in the abdominal area.  You have persistent nausea, vomiting, or diarrhea.  You have a bad smelling vaginal discharge.  You have pain when you urinate. Get help right away if:  You have a fever.  You are leaking fluid from your vagina.  You have spotting or bleeding from your vagina.  You have severe abdominal cramping or pain.  You have rapid weight gain or weight loss.  You have shortness of breath with chest pain.  You notice sudden or extreme swelling of your face, hands, ankles, feet, or legs.  You have not felt your baby move in over an hour.  You have severe headaches that do not go away when you take medicine.  You have vision changes. Summary  The second trimester is from week 14 through week 27 (months 4 through 6). It is also a time when the fetus is growing rapidly.  Your  body goes through many changes during pregnancy. The changes vary from woman to woman.  Avoid all smoking, herbs, alcohol, and unprescribed drugs. These chemicals affect the formation and growth your baby.  Do not use any tobacco products, such as cigarettes, chewing tobacco, and e-cigarettes. If you need help quitting, ask your health care provider.  Contact your health care provider if you have any questions. Keep all prenatal visits as told by your health care provider. This is important. This information is not intended to replace advice given to you  by your health care provider. Make sure you discuss any questions you have with your health care provider. Document Released: 07/20/2001 Document Revised: 01/01/2016 Document Reviewed: 09/26/2012 Elsevier Interactive Patient Education  2017 [employer].

## 2017-04-17 DIAGNOSIS — O99891 Other specified diseases and conditions complicating pregnancy: Secondary | ICD-10-CM | POA: Insufficient documentation

## 2017-04-17 DIAGNOSIS — Z8659 Personal history of other mental and behavioral disorders: Secondary | ICD-10-CM | POA: Insufficient documentation

## 2017-04-17 DIAGNOSIS — O9989 Other specified diseases and conditions complicating pregnancy, childbirth and the puerperium: Secondary | ICD-10-CM

## 2017-04-17 NOTE — Progress Notes (Signed)
NEW OB HISTORY AND PHYSICAL  SUBJECTIVE:       Carrie Vasquez is a 31 y.o. [id number] female, Patient's last menstrual period was 12/16/2016., Estimated Date of Delivery: 10/19/17, [id number], presents today for establishment of Prenatal Care. Accompanied by husband and child.   Reports breast tenderness and decreased vomiting.  Denies difficulty breathing or respiratory distress, chest pain, abdominal pain, vaginal bleeding, dysuria, and leg pain or swelling.   Currently nursing older child two (2) to three (3) times a day.   Declines genetic screening.   Gynecologic History  Patient's last menstrual period was 12/16/2016.   Contraception: none   Last Pap: 2016. Results were: normal  Obstetric History OB History  Gravida Para Term Preterm AB Living  2 1 1  0 0 1  SAB TAB Ectopic Multiple Live Births  0 0 0 0 1    # Outcome Date GA Lbr Len/2nd Weight Sex Delivery Anes PTL Lv  2 Current           1 Term 10/20/15 [id number] 05:00 / 02:38 8 lb 5 oz (3.77 kg) F Vag-Spont EPI  LIV      Past Medical History:  Diagnosis Date  . Anxiety    does not take medication  . Depression    does not take medication  . Gestational hypertension 10/18/2015  . MRSA (methicillin resistant Staphylococcus aureus)    h/o MRSA = 3-4 years ago = cleared = culture 7/22 negative  . Pregnancy induced hypertension   . Tobacco abuse     Past Surgical History:  Procedure Laterality Date  . ADENOIDECTOMY  1996  . APPENDECTOMY  1993  . WISDOM TOOTH EXTRACTION     one removed at age 31    Current Outpatient Prescriptions on File Prior to Visit  Medication Sig Dispense Refill  . Prenat-FeCbn-FeAsp-Meth-FA-DHA (PRENATE MINI) 18-0.6-0.4-350 MG CAPS Take 1 tablet by mouth daily. 30 capsule 3   No current facility-administered medications on file prior to visit.     Allergies  Allergen Reactions  . Sulfa Antibiotics Hives    Social History   Social History  . Marital status: Married    Spouse name: N/A   . Number of children: N/A  . Years of education: N/A   Occupational History  . Not on file.   Social History Main Topics  . Smoking status: Former Smoker    Quit date: 02/14/2015  . Smokeless tobacco: Never Used  . Alcohol use No  . Drug use: No  . Sexual activity: Yes   Other Topics Concern  . Not on file   Social History Narrative   Works as a Hospital doctorhealthcare billing specialist.   Highest level of education completed GED with some college.   Enjoys working out, Tourist information centre managerarts and crafts.       Family History  Problem Relation Age of Onset  . Hypertension Paternal Grandfather   . Diabetes Maternal Grandfather   . Cancer Maternal Grandfather        leukemia  . Epilepsy Brother     The following portions of the patient's history were reviewed and updated as appropriate: allergies, current medications, past OB history, past medical history, past surgical history, past family history, past social history, and problem list.  OBJECTIVE:  Initial Physical Exam (New OB)  GENERAL APPEARANCE: alert, well appearing, in no apparent distress  HEAD: normocephalic, atraumatic  MOUTH: mucous membranes moist, pharynx normal without lesions and dental hygiene good  THYROID: no thyromegaly or masses present  BREASTS:  no masses noted, no significant tenderness, no palpable axillary nodes, no skin changes, lactating  LUNGS: clear to auscultation, no wheezes, rales or rhonchi, symmetric air entry  HEART: regular rate and rhythm, no murmurs  ABDOMEN: soft, nontender, nondistended, no abnormal masses, no epigastric pain, fundus not palpable and FHT present  EXTREMITIES: no redness or tenderness in the calves or thighs, no edema  SKIN: normal coloration and turgor, no rashes  LYMPH NODES: no adenopathy palpable  NEUROLOGIC: alert, oriented, normal speech, no focal findings or movement disorder noted  PELVIC EXAM EXTERNAL GENITALIA: normal appearing vulva with no masses, tenderness or  lesions VAGINA: no abnormal discharge or lesions CERVIX: no lesions or cervical motion tenderness UTERUS: gravid, consistent with 12 weeks ADNEXA: no masses palpable and nontender OB EXAM PELVIMETRY: appears adequate  ASSESSMENT: Normal pregnancy Previous pregnancy with gestational hypertension, currently pregnant History of postpartum depression  PLAN: Prenatal care Rx: Aspirin, see orders. New OB counseling: The patient has been given an overview regarding routine prenatal care. Recommendations regarding diet, weight gain, and exercise in pregnancy were given. Prenatal testing, optional genetic testing, and ultrasound use in pregnancy were reviewed.  Benefits of Breast Feeding were discussed. The patient is encouraged to consider nursing her baby post partum. See orders   Gunnar Bulla, CNM

## 2017-04-22 ENCOUNTER — Encounter: Payer: 59 | Admitting: Certified Nurse Midwife

## 2017-04-26 ENCOUNTER — Ambulatory Visit (INDEPENDENT_AMBULATORY_CARE_PROVIDER_SITE_OTHER): Payer: Self-pay | Admitting: Certified Nurse Midwife

## 2017-04-26 VITALS — BP 114/68 | HR 85 | Wt 122.8 lb

## 2017-04-26 DIAGNOSIS — F329 Major depressive disorder, single episode, unspecified: Secondary | ICD-10-CM

## 2017-04-26 DIAGNOSIS — F32A Depression, unspecified: Secondary | ICD-10-CM

## 2017-04-26 DIAGNOSIS — F419 Anxiety disorder, unspecified: Secondary | ICD-10-CM

## 2017-04-26 DIAGNOSIS — Z79899 Other long term (current) drug therapy: Secondary | ICD-10-CM

## 2017-04-26 MED ORDER — SERTRALINE HCL 50 MG PO TABS: 50.0000 mg | ORAL_TABLET | Freq: Every day | ORAL | 5 refills | Status: DC

## 2017-04-26 NOTE — Patient Instructions (Signed)

## 2017-04-26 NOTE — Progress Notes (Signed)
Subjective:   Carrie Vasquez is a 31 y.o. G2P1001 [redacted]w[redacted]d being seen today for her medication management. She was originally seen by myself on 04/08/2017 for NOB physical and started on Zoloft for anxiety and depression.   No contractions, vaginal bleeding or leaking of fluid.  No SI/HI.   Denies difficulty breathing or respiratory distress, chest pain, abdominal pain, dysuria, leg pain or swelling.   The following portions of the patient's history were reviewed and updated as appropriate: allergies, current medications, past family history, past medical history, past social history, past surgical history and problem list.   Objective:  BP 114/68   Pulse 85   Wt 122 lb 12.8 oz (55.7 kg)   LMP 12/16/2016   BMI 21.08 kg/m    Alert and oriented x 4, no apparent distress.   Depression screen [facility] 2/9 04/26/2017 04/08/2017  Decreased Interest 1 3  Down, Depressed, Hopeless 1 2  PHQ - 2 Score 2 5  Altered sleeping 2 2  Tired, decreased energy 2 3  Change in appetite 1 2  Feeling bad or failure about yourself  1 2  Trouble concentrating 0 3  Moving slowly or fidgety/restless 0 2  Suicidal thoughts 0 1  PHQ-9 Score 8 20  Difficult doing work/chores Not difficult at all Extremely dIfficult    Assessment:   Pregnancy:  G2P1001 at [redacted]w[redacted]d  Medication management  Anxiety and depression in pregnancy  Plan:   Continue medication as ordered.   Rx: Zoloft, see orders.   Reviewed red flag symptoms and when to call.   RTC x 2 weeks for ROB as previously scheduled.    Gunnar Bulla, CNM

## 2017-05-09 ENCOUNTER — Ambulatory Visit (INDEPENDENT_AMBULATORY_CARE_PROVIDER_SITE_OTHER): Payer: Self-pay | Admitting: Certified Nurse Midwife

## 2017-05-09 VITALS — BP 122/73 | HR 63 | Wt 123.7 lb

## 2017-05-09 DIAGNOSIS — Z3A20 20 weeks gestation of pregnancy: Secondary | ICD-10-CM

## 2017-05-09 MED ORDER — SERTRALINE HCL 50 MG PO TABS: 50.0000 mg | ORAL_TABLET | Freq: Every day | ORAL | 5 refills | Status: DC

## 2017-05-09 NOTE — Progress Notes (Signed)
ROB- pt is doing well, states medication has helped her alot

## 2017-05-09 NOTE — Progress Notes (Signed)
ROB, doing well. No complaints. Discussed anatomy u/s at next visit. Follow up in 4 wks.   Doreene Burke, CNM

## 2017-05-09 NOTE — Patient Instructions (Signed)
Round Ligament Pain The round ligament is a cord of muscle and tissue that helps to support the uterus. It can become a source of pain during pregnancy if it becomes stretched or twisted as the baby grows. The pain usually begins in the second trimester of pregnancy, and it can come and go until the baby is delivered. It is not a serious problem, and it does not cause harm to the baby. Round ligament pain is usually a short, sharp, and pinching pain, but it can also be a dull, lingering, and aching pain. The pain is felt in the lower side of the abdomen or in the groin. It usually starts deep in the groin and moves up to the outside of the hip area. Pain can occur with:  A sudden change in position.  Rolling over in bed.  Coughing or sneezing.  Physical activity.  Follow these instructions at home: Watch your condition for any changes. Take these steps to help with your pain:  When the pain starts, relax. Then try: ? Sitting down. ? Flexing your knees up to your abdomen. ? Lying on your side with one pillow under your abdomen and another pillow between your legs. ? Sitting in a warm bath for 15-20 minutes or until the pain goes away.  Take over-the-counter and prescription medicines only as told by your health care provider.  Move slowly when you sit and stand.  Avoid long walks if they cause pain.  Stop or lessen your physical activities if they cause pain.  Contact a health care provider if:  Your pain does not go away with treatment.  You feel pain in your back that you did not have before.  Your medicine is not helping. Get help right away if:  You develop a fever or chills.  You develop uterine contractions.  You develop vaginal bleeding.  You develop nausea or vomiting.  You develop diarrhea.  You have pain when you urinate. This information is not intended to replace advice given to you by your health care provider. Make sure you discuss any questions you have  with your health care provider. Document Released: 05/04/2008 Document Revised: 01/01/2016 Document Reviewed: 10/02/2014 Elsevier Interactive Patient Education  2018 [employer] How a Baby Grows During Pregnancy Pregnancy begins when a female's sperm enters a female's egg (fertilization). This happens in one of the tubes (fallopian tubes) that connect the ovaries to the womb (uterus). The fertilized egg is called an embryo until it reaches 10 weeks. From 10 weeks until birth, it is called a fetus. The fertilized egg moves down the fallopian tube to the uterus. Then it implants into the lining of the uterus and begins to grow. The developing fetus receives oxygen and nutrients through the pregnant woman's bloodstream and the tissues that grow (placenta) to support the fetus. The placenta is the life support system for the fetus. It provides nutrition and removes waste. Learning as much as you can about your pregnancy and how your baby is developing can help you enjoy the experience. It can also make you aware of when there might be a problem and when to ask questions. How long does a typical pregnancy last? A pregnancy usually lasts 280 days, or about 40 weeks. Pregnancy is divided into three trimesters:  First trimester: 0-13 weeks.  Second trimester: 14-27 weeks.  Third trimester: 28-40 weeks.  The day when your baby is considered ready to be born (full term) is your estimated date of delivery. How does   my baby develop month by month? First month  The fertilized egg attaches to the inside of the uterus.  Some cells will form the placenta. Others will form the fetus.  The arms, legs, brain, spinal cord, lungs, and heart begin to develop.  At the end of the first month, the heart begins to beat.  Second month  The bones, inner ear, eyelids, hands, and feet form.  The genitals develop.  By the end of 8 weeks, all major organs are developing.  Third month  All of the internal  organs are forming.  Teeth develop below the gums.  Bones and muscles begin to grow. The spine can flex.  The skin is transparent.  Fingernails and toenails begin to form.  Arms and legs continue to grow longer, and hands and feet develop.  The fetus is about 3 in (7.6 cm) long.  Fourth month  The placenta is completely formed.  The external sex organs, neck, outer ear, eyebrows, eyelids, and fingernails are formed.  The fetus can hear, swallow, and move its arms and legs.  The kidneys begin to produce urine.  The skin is covered with a white waxy coating (vernix) and very fine hair (lanugo).  Fifth month  The fetus moves around more and can be felt for the first time (quickening).  The fetus starts to sleep and wake up and may begin to suck its finger.  The nails grow to the end of the fingers.  The organ in the digestive system that makes bile (gallbladder) functions and helps to digest the nutrients.  If your baby is a girl, eggs are present in her ovaries. If your baby is a boy, testicles start to move down into his scrotum.  Sixth month  The lungs are formed, but the fetus is not yet able to breathe.  The eyes open. The brain continues to develop.  Your baby has fingerprints and toe prints. Your baby's hair grows thicker.  At the end of the second trimester, the fetus is about 9 in (22.9 cm) long.  Seventh month  The fetus kicks and stretches.  The eyes are developed enough to sense changes in light.  The hands can make a grasping motion.  The fetus responds to sound.  Eighth month  All organs and body systems are fully developed and functioning.  Bones harden and taste buds develop. The fetus may hiccup.  Certain areas of the brain are still developing. The skull remains soft.  Ninth month  The fetus gains about  lb (0.23 kg) each week.  The lungs are fully developed.  Patterns of sleep develop.  The fetus's head typically moves into a  head-down position (vertex) in the uterus to prepare for birth. If the buttocks move into a vertex position instead, the baby is breech.  The fetus weighs 6-9 lbs (2.72-4.08 kg) and is 19-20 in (48.26-50.8 cm) long.  What can I do to have a healthy pregnancy and help my baby develop? Eating and Drinking  Eat a healthy diet. ? Talk with your health care provider to make sure that you are getting the nutrients that you and your baby need. ? Visit www.choosemyplate.gov to learn about creating a healthy diet.  Gain a healthy amount of weight during pregnancy as advised by your health care provider. This is usually 25-35 pounds. You may need to: ? Gain more if you were underweight before getting pregnant or if you are pregnant with more than one baby. ? Gain less   if you were overweight or obese when you got pregnant.  Medicines and Vitamins  Take prenatal vitamins as directed by your health care provider. These include vitamins such as folic acid, iron, calcium, and vitamin D. They are important for healthy development.  Take medicines only as directed by your health care provider. Read labels and ask a pharmacist or your health care provider whether over-the-counter medicines, supplements, and prescription drugs are safe to take during pregnancy.  Activities  Be physically active as advised by your health care provider. Ask your health care provider to recommend activities that are safe for you to do, such as walking or swimming.  Do not participate in strenuous or extreme sports.  Lifestyle  Do not drink alcohol.  Do not use any tobacco products, including cigarettes, chewing tobacco, or electronic cigarettes. If you need help quitting, ask your health care provider.  Do not use illegal drugs.  Safety  Avoid exposure to mercury, lead, or other heavy metals. Ask your health care provider about common sources of these heavy metals.  Avoid listeria infection during pregnancy. Follow  these precautions: ? Do not eat soft cheeses or deli meats. ? Do not eat hot dogs unless they have been warmed up to the point of steaming, such as in the microwave oven. ? Do not drink unpasteurized milk.  Avoid toxoplasmosis infection during pregnancy. Follow these precautions: ? Do not change your cat's litter box, if you have a cat. Ask someone else to do this for you. ? Wear gardening gloves while working in the yard.  General Instructions  Keep all follow-up visits as directed by your health care provider. This is important. This includes prenatal care and screening tests.  Manage any chronic health conditions. Work closely with your health care provider to keep conditions, such as diabetes, under control.  How do I know if my baby is developing well? At each prenatal visit, your health care provider will do several different tests to check on your health and keep track of your baby's development. These include:  Fundal height. ? Your health care provider will measure your growing belly from top to bottom using a tape measure. ? Your health care provider will also feel your belly to determine your baby's position.  Heartbeat. ? An ultrasound in the first trimester can confirm pregnancy and show a heartbeat, depending on how far along you are. ? Your health care provider will check your baby's heart rate at every prenatal visit. ? As you get closer to your delivery date, you may have regular fetal heart rate monitoring to make sure that your baby is not in distress.  Second trimester ultrasound. ? This ultrasound checks your baby's development. It also indicates your baby's gender.  What should I do if I have concerns about my baby's development? Always talk with your health care provider about any concerns that you may have. This information is not intended to replace advice given to you by your health care provider. Make sure you discuss any questions you have with your health  care provider. Document Released: 01/12/2008 Document Revised: 01/01/2016 Document Reviewed: 01/02/2014 Elsevier Interactive Patient Education  2018 [employer] Inc[employer]  

## 2017-06-07 ENCOUNTER — Encounter: Payer: Self-pay | Admitting: Certified Nurse Midwife

## 2017-06-07 ENCOUNTER — Ambulatory Visit (INDEPENDENT_AMBULATORY_CARE_PROVIDER_SITE_OTHER): Payer: Medicaid Other | Admitting: Certified Nurse Midwife

## 2017-06-07 ENCOUNTER — Ambulatory Visit (INDEPENDENT_AMBULATORY_CARE_PROVIDER_SITE_OTHER): Payer: Medicaid Other

## 2017-06-07 VITALS — BP 121/72 | HR 69 | Wt 129.3 lb

## 2017-06-07 DIAGNOSIS — R319 Hematuria, unspecified: Secondary | ICD-10-CM

## 2017-06-07 DIAGNOSIS — Z3A20 20 weeks gestation of pregnancy: Secondary | ICD-10-CM

## 2017-06-07 DIAGNOSIS — Z3482 Encounter for supervision of other normal pregnancy, second trimester: Secondary | ICD-10-CM

## 2017-06-07 LAB — POCT URINALYSIS DIPSTICK
BILIRUBIN UA: NEGATIVE
GLUCOSE UA: NEGATIVE
KETONES UA: NEGATIVE
LEUKOCYTES UA: NEGATIVE
Nitrite, UA: NEGATIVE
Protein, UA: NEGATIVE
SPEC GRAV UA: 1.01 (ref 1.010–1.025)
Urobilinogen, UA: 0.2 E.U./dL
pH, UA: 8 (ref 5.0–8.0)

## 2017-06-07 NOTE — Addendum Note (Signed)
Addended by: Jackquline DenmarkIDGEWAY, Ermine Stebbins W on: 06/07/2017 02:23 PM   Modules accepted: Orders

## 2017-06-07 NOTE — Patient Instructions (Signed)

## 2017-06-07 NOTE — Progress Notes (Signed)
ROB doing well. No complaints is feeling fetal movement. Ultrasound today for anatomy was normal. See below for full report. Reviewed q 4 wk  Visits  Until 28 wks. Follow up 4 wks with Melody.   Doreene BurkeAnnie Elaine Vasquez, CNM  ULTRASOUND REPORT  Location: [facility] Date of Service:  06/07/17  Indications: Anatomy Findings:  Singleton intrauterine pregnancy is visualized with FHR at 158 BPM. Biometrics give an (U/S) Gestational age of 30 2/7 weeks and an (U/S) EDD of 10/16/17; this correlates with the clinically established EDD of 10/19/17.  Fetal presentation is vertex.  EFW: 387 grams (0lb 14oz).   Placenta: Posterior and grade 1. AFI: WNL subjectively.  Anatomic survey is complete and appears WNL. Gender - Female.   Right Ovary was not visualized. Left Ovary measures 2.8 x 2.1 x 2.5 cm and appears WNL.  There is no obvious evidence of a corpus luteal cyst. Survey of the adnexa demonstrates no adnexal masses. There is no free peritoneal fluid in the cul de sac.  Impression: 1. 21 2/7 week Viable Singleton Intrauterine pregnancy by U/S. 2. (U/S) EDD is consistent with Clinically established (LMP) EDD of 10/19/17. 3. Normal Anatomy Scan  Recommendations: 1.Clinical correlation with the patient's History and Physical Exam.   Kari BaarsJill Long, RDMS

## 2017-06-09 LAB — CULTURE, OB URINE

## 2017-06-09 LAB — URINE CULTURE, OB REFLEX

## 2017-07-06 ENCOUNTER — Ambulatory Visit (INDEPENDENT_AMBULATORY_CARE_PROVIDER_SITE_OTHER): Payer: Medicaid Other | Admitting: Obstetrics and Gynecology

## 2017-07-06 VITALS — BP 113/74 | HR 72 | Wt 135.0 lb

## 2017-07-06 DIAGNOSIS — Z3492 Encounter for supervision of normal pregnancy, unspecified, second trimester: Secondary | ICD-10-CM

## 2017-07-06 DIAGNOSIS — Z23 Encounter for immunization: Secondary | ICD-10-CM | POA: Diagnosis not present

## 2017-07-06 LAB — POCT URINALYSIS DIPSTICK
Bilirubin, UA: NEGATIVE
Blood, UA: NEGATIVE
Glucose, UA: NEGATIVE
Ketones, UA: NEGATIVE
LEUKOCYTES UA: NEGATIVE
NITRITE UA: NEGATIVE
PROTEIN UA: NEGATIVE
Spec Grav, UA: 1.01 (ref 1.010–1.025)
UROBILINOGEN UA: 0.2 U/dL
pH, UA: 6 (ref 5.0–8.0)

## 2017-07-06 NOTE — Progress Notes (Signed)
ROB-doing well, sleep deprived with 55month old; glucola next visit. Flu vaccine given today.

## 2017-07-06 NOTE — Addendum Note (Signed)
Addended by: Rosine BeatLONTZ, Tariyah Pendry L on: 07/06/2017 04:22 PM   Modules accepted: Orders

## 2017-07-06 NOTE — Progress Notes (Signed)
ROB- pt is doing well 

## 2017-07-28 ENCOUNTER — Other Ambulatory Visit: Payer: Medicaid Other

## 2017-07-28 ENCOUNTER — Ambulatory Visit (INDEPENDENT_AMBULATORY_CARE_PROVIDER_SITE_OTHER): Payer: Medicaid Other | Admitting: Certified Nurse Midwife

## 2017-07-28 ENCOUNTER — Encounter: Payer: Self-pay | Admitting: Certified Nurse Midwife

## 2017-07-28 VITALS — BP 110/72 | HR 83 | Wt 140.7 lb

## 2017-07-28 DIAGNOSIS — Z23 Encounter for immunization: Secondary | ICD-10-CM

## 2017-07-28 DIAGNOSIS — Z113 Encounter for screening for infections with a predominantly sexual mode of transmission: Secondary | ICD-10-CM

## 2017-07-28 DIAGNOSIS — Z3403 Encounter for supervision of normal first pregnancy, third trimester: Secondary | ICD-10-CM

## 2017-07-28 LAB — POCT URINALYSIS DIPSTICK
BILIRUBIN UA: NEGATIVE
GLUCOSE UA: NEGATIVE
Ketones, UA: NEGATIVE
Nitrite, UA: NEGATIVE
PH UA: 8 (ref 5.0–8.0)
Protein, UA: NEGATIVE
SPEC GRAV UA: 1.02 (ref 1.010–1.025)
UROBILINOGEN UA: 0.2 U/dL

## 2017-07-28 NOTE — Progress Notes (Signed)
ROB-Doing well. CBC, Glucola, and RPR today. Blood transfusion consent reviewed and signed. TDaP given. Desires NCB and interested in hydrotherapy. Plans breastfeeding. Waterbirth class schedule, postpartum contraception booklet, [facility] volunteer doula pamphlet, and cord blood banking brochure given. Anticipatory guidance regarding course of prenatal care. Reviewed red flag symptoms and when to call. RTC x 2 weeks for ROB with Pattricia BossAnnie or sooner if needed.

## 2017-07-28 NOTE — Patient Instructions (Signed)
Common Medications Safe in Pregnancy  Acne:      Constipation:  Benzoyl Peroxide     Colace  Clindamycin      Dulcolax Suppository  Topica Erythromycin     Fibercon  Salicylic Acid      Metamucil         Miralax AVOID:        Senakot   Accutane    Cough:  Retin-A       Cough Drops  Tetracycline      Phenergan w/ Codeine if Rx  Minocycline      Robitussin (Plain & DM)  Antibiotics:     Crabs/Lice:  Ceclor       RID  Cephalosporins    AVOID:  E-Mycins      Kwell  Keflex  Macrobid/Macrodantin   Diarrhea:  Penicillin      Kao-Pectate  Zithromax      Imodium AD         PUSH FLUIDS AVOID:       Cipro     Fever:  Tetracycline      Tylenol (Regular or Extra  Minocycline       Strength)  Levaquin      Extra Strength-Do not          Exceed 8 tabs/24 hrs Caffeine:        <200mg/day (equiv. To 1 cup of coffee or  approx. 3 12 oz sodas)         Gas: Cold/Hayfever:       Gas-X  Benadryl      Mylicon  Claritin       Phazyme  **Claritin-D        Chlor-Trimeton    Headaches:  Dimetapp      ASA-Free Excedrin  Drixoral-Non-Drowsy     Cold Compress  Mucinex (Guaifenasin)     Tylenol (Regular or Extra  Sudafed/Sudafed-12 Hour     Strength)  **Sudafed PE Pseudoephedrine   Tylenol Cold & Sinus     Vicks Vapor Rub  Zyrtec  **AVOID if Problems With Blood Pressure         Heartburn: Avoid lying down for at least 1 hour after meals  Aciphex      Maalox     Rash:  Milk of Magnesia     Benadryl    Mylanta       1% Hydrocortisone Cream  Pepcid  Pepcid Complete   Sleep Aids:  Prevacid      Ambien   Prilosec       Benadryl  Rolaids       Chamomile Tea  Tums (Limit 4/day)     Unisom  Zantac       Tylenol PM         Warm milk-add vanilla or  Hemorrhoids:       Sugar for taste  Anusol/Anusol H.C.  (RX: Analapram 2.5%)  Sugar Substitutes:  Hydrocortisone OTC     Ok in moderation  Preparation H      Tucks        Vaseline lotion applied to tissue with  wiping    Herpes:     Throat:  Acyclovir      Oragel  Famvir  Valtrex     Vaccines:         Flu Shot Leg Cramps:       *Gardasil  Benadryl      Hepatitis A         Hepatitis B Nasal Spray:         Pneumovax  Saline Nasal Spray     Polio Booster         Tetanus Nausea:       Tuberculosis test or PPD  Vitamin B6 25 mg TID   AVOID:    Dramamine      *Gardasil  Emetrol       Live Poliovirus  Ginger Root 250 mg QID    MMR (measles, mumps &  High Complex Carbs @ Bedtime    rebella)  Sea Bands-Accupressure    Varicella (Chickenpox)  Unisom 1/2 tab TID     *No known complications           If received before Pain:         Known pregnancy;   Darvocet       Resume series after  Lortab        Delivery  Percocet    Yeast:   Tramadol      Femstat  Tylenol 3      Gyne-lotrimin  Ultram       Monistat  Vicodin           MISC:         All Sunscreens           Hair Coloring/highlights          Insect Repellant's          (Including DEET)         [employer]stic Tans Cord Blood Banking Information Cord blood banking is the process of collecting and storing the blood that is in the umbilical cord and placenta at the time of delivery. This blood contains stem cells, which can be used to treat many blood diseases, immune system disorders, and childhood cancers. Stem cells can also be used to research certain diseases and treatments. Many people who choose cord blood banking donate the blood. Donated blood can be used in lifesaving treatments or for research. Other people choose to store the blood privately. Blood that is stored privately can only be used with the person's permission. This option is often chosen if:  A family member needs a stem cell transplant.  The child is part of an ethnic minority.  The child was conceived through in vitro fertilization.  What should I look for in a blood bank? A blood bank is the organization that coordinates cord blood banking. Make sure the cord blood bank that  you use:  Is accredited.  Is financially stable.  Handles a large volume of cord blood samples.  Has a procedure in place for transport and storage.  Allows you the option of transferring your cord blood sample.  Has a procedure in place if the bank goes out of business.  Clearly states all costs and limits to future costs.  People who choose to donate cord blood should not need to pay for blood banking. People who keep the blood for private use will need to pay for the first (initial) storage and pay a fee each year (annual fee). Other fees may also apply. What are the risks of cord blood banking? There are no health risks associated with cord blood banking. It is considered safe. How should I prepare? You must schedule this process at least 4-6 weeks before you will be giving birth. How is the blood collected? The blood is collected as soon as the baby has been delivered. Within 15 minutes of delivery, a health care provider will take these actions to collect the blood:  Clamp the  umbilical cord at the top and bottom. This traps the blood in the umbilical cord.  Use a syringe or bag to collect the blood.  Insert needles into the placenta to collect (draw out) more blood.  What happens after the blood is collected? After the blood has been collected:  The blood will be sent to a blood bank.  The blood will be tested for genetic problems and infectious diseases. If the blood tests positive for a genetic problem or a disease, someone will contact you and let you know.  The blood will be frozen.  If your child develops a genetic condition, immune system disorder, or cancer, you will be responsible for contacting the blood bank and letting them know. This information is not intended to replace advice given to you by your health care provider. Make sure you discuss any questions you have with your health care provider. Document Released: 01/13/2010 Document Revised: 01/01/2016  Document Reviewed: 01/13/2015 Elsevier Interactive Patient Education  2018 [employer]. Pain Relief During Labor and Delivery Many things can cause pain during labor and delivery, including:  Pressure on bones and ligaments due to the baby moving through the pelvis.  Stretching of tissues due to the baby moving through the birth canal.  Muscle tension due to anxiety or nervousness.  The uterus tightening (contracting) and relaxing to help move the baby.  There are many ways to deal with the pain of labor and delivery. They include:  Taking prenatal classes. Taking these classes helps you know what to expect during your baby's birth. What you learn will increase your confidence and decrease your anxiety.  Practicing relaxation techniques or doing relaxing activities, such as: ? Focused breathing. ? Meditation. ? Visualization. ? Aroma therapy. ? Listening to your favorite music. ? Hypnosis.  Taking a warm shower or bath (hydrotherapy). This may: ? Provide comfort and relaxation. ? Lessen your perception of pain. ? Decrease the amount of pain medicine needed. ? Decrease the length of labor.  Getting a massage or counterpressure on your back.  Applying warm packs or ice packs.  Changing positions often, moving around, or using a birthing ball.  Getting: ? Pain medicine through an IV or injection into a muscle. ? Pain medicine inserted into your spinal column. ? Injections of sterile water just under the skin on your lower back (intradermal injections). ? Laughing gas (nitrous oxide).  Discuss your pain control options with your health care provider during your prenatal visits. Explore the options offered by your hospital or birth center. What kinds of medicine are available? There are two kinds of medicines that can be used to relieve pain during labor and delivery:  Analgesics. These medicines decrease pain without causing you to lose feeling or the ability to move your  muscles.  Anesthetics. These medicines block feeling in the body and can decrease your ability to move freely.  Both of these kinds of medicine can cause minor side effects, such as nausea, trouble concentrating, and sleepiness. They can also decrease the baby's heart rate before birth and affect the baby's breathing rate after birth. For this reason, health care providers are careful about when and how much medicine is given. What are specific medicines and procedures that provide pain relief? Local Anesthetics Local anesthetics are used to numb a small area of the body. They may be used along with another kind of anesthetic or used to numb the nerves of the vagina, cervix, and perineum during the second stage of labor.  General Anesthetics General anesthetics cause you to lose consciousness so you do not feel pain. They are usually only used for an emergency cesarean delivery. General anesthetics are given through an IV tube and a mask. Pudendal Block A pudendal block is a form of local anesthetic. It may be used to relieve the pain associated with pushing or stretching of the perineum at the time of delivery or to further numb the perineum. A pudendal block is done by injecting numbing medicine through the vaginal wall into a nerve in the pelvis. Epidural Analgesia Epidural analgesia is given through a flexible IV catheter that is inserted into the lower back. Numbing medicine is delivered continuously to the area near your spinal column nerves (epidural space). After having this type of analgesia, you may be able to move your legs but you most likely will not be able to walk. Depending on the amount of medicine given, you may lose all feeling in the lower half of your body, or you may retain some level of sensation, including the urge to push. Epidural analgesia can be used to provide pain relief for a vaginal birth. Spinal Block A spinal block is similar to epidural analgesia, but the medicine is  injected into the spinal fluid instead of the epidural space. A spinal block is only given once. It starts to relieve pain quickly, but the pain relief lasts only 1-6 hours. Spinal blocks can be used for cesarean deliveries. Combined Spinal-Epidural (CSE) Block A CSE block combines the effects of a spinal block and epidural analgesia. The spinal block works quickly to block all pain. The epidural analgesia provides continuous pain relief, even after the effects of the spinal block have worn off. This information is not intended to replace advice given to you by your health care provider. Make sure you discuss any questions you have with your health care provider. Document Released: 11/11/2008 Document Revised: 01/02/2016 Document Reviewed: 12/17/2015 Elsevier Interactive Patient Education  2018 [place] of Pregnancy The third trimester is from week 29 through week 42, months 7 through 9. This trimester is when your unborn baby (fetus) is growing very fast. At the end of the ninth month, the unborn baby is about 20 inches in length. It weighs about 6-10 pounds. Follow these instructions at home:  Avoid all smoking, herbs, and alcohol. Avoid drugs not approved by your doctor.  Do not use any tobacco products, including cigarettes, chewing tobacco, and electronic cigarettes. If you need help quitting, ask your doctor. You may get counseling or other support to help you quit.  Only take medicine as told by your doctor. Some medicines are safe and some are not during pregnancy.  Exercise only as told by your doctor. Stop exercising if you start having cramps.  Eat regular, healthy meals.  Wear a good support bra if your breasts are tender.  Do not use hot tubs, steam rooms, or saunas.  Wear your seat belt when driving.  Avoid raw meat, uncooked cheese, and liter boxes and soil used by cats.  Take your prenatal vitamins.  Take 1500-2000 milligrams of calcium daily  starting at the 20th week of pregnancy until you deliver your baby.  Try taking medicine that helps you poop (stool softener) as needed, and if your doctor approves. Eat more fiber by eating fresh fruit, vegetables, and whole grains. Drink enough fluids to keep your pee (urine) clear or pale yellow.  Take warm water baths (sitz baths) to soothe pain or discomfort  caused by hemorrhoids. Use hemorrhoid cream if your doctor approves.  If you have puffy, bulging veins (varicose veins), wear support hose. Raise (elevate) your feet for 15 minutes, 3-4 times a day. Limit salt in your diet.  Avoid heavy lifting, wear low heels, and sit up straight.  Rest with your legs raised if you have leg cramps or low back pain.  Visit your dentist if you have not gone during your pregnancy. Use a soft toothbrush to brush your teeth. Be gentle when you floss.  You can have sex (intercourse) unless your doctor tells you not to.  Do not travel far distances unless you must. Only do so with your doctor's approval.  Take prenatal classes.  Practice driving to the hospital.  Pack your hospital bag.  Prepare the baby's room.  Go to your doctor visits. Get help if:  You are not sure if you are in labor or if your water has broken.  You are dizzy.  You have mild cramps or pressure in your lower belly (abdominal).  You have a nagging pain in your belly area.  You continue to feel sick to your stomach (nauseous), throw up (vomit), or have watery poop (diarrhea).  You have bad smelling fluid coming from your vagina.  You have pain with peeing (urination). Get help right away if:  You have a fever.  You are leaking fluid from your vagina.  You are spotting or bleeding from your vagina.  You have severe belly cramping or pain.  You lose or gain weight rapidly.  You have trouble catching your breath and have chest pain.  You notice sudden or extreme puffiness (swelling) of your face, hands,  ankles, feet, or legs.  You have not felt the baby move in over an hour.  You have severe headaches that do not go away with medicine.  You have vision changes. This information is not intended to replace advice given to you by your health care provider. Make sure you discuss any questions you have with your health care provider. Document Released: 10/20/2009 Document Revised: 01/01/2016 Document Reviewed: 09/26/2012 Elsevier Interactive Patient Education  2017 [employer].

## 2017-07-28 NOTE — Progress Notes (Signed)
ROB- Pt states she is doing well, Tdap- today, pt would like to discuss Blood transfusion form further

## 2017-07-29 ENCOUNTER — Telehealth: Payer: Self-pay

## 2017-07-29 LAB — CBC
Hematocrit: 36.5 % (ref 34.0–46.6)
Hemoglobin: 11.8 g/dL (ref 11.1–15.9)
MCH: 30.6 pg (ref 26.6–33.0)
MCHC: 32.3 g/dL (ref 31.5–35.7)
MCV: 95 fL (ref 79–97)
PLATELETS: 177 10*3/uL (ref 150–379)
RBC: 3.85 x10E6/uL (ref 3.77–5.28)
RDW: 13.5 % (ref 12.3–15.4)
WBC: 8.2 10*3/uL (ref 3.4–10.8)

## 2017-07-29 LAB — GLUCOSE TOLERANCE, 1 HOUR: Glucose, 1Hr PP: 121 mg/dL (ref 65–199)

## 2017-07-29 LAB — RPR: RPR Ser Ql: NONREACTIVE

## 2017-07-29 NOTE — Telephone Encounter (Signed)
Return call from patient. Verified full name and date of birth.   Lab results reviewed, all within normal limit. Pt verbalized understanding. Encouraged to activate MyChart.    Gunnar BullaJenkins Michelle Amareon Phung, CNM [facility], [facility]

## 2017-07-29 NOTE — Telephone Encounter (Signed)
-----   Message from Gunnar BullaJenkins Michelle Lawhorn, CNM sent at 07/29/2017  8:55 AM EST ----- Please contact pt. 28 week labs are all within normal limits. Encourage to activate MyChart. JML

## 2017-07-29 NOTE — Telephone Encounter (Signed)
Left message on machine to call back  

## 2017-08-09 NOTE — L&D Delivery Note (Signed)
Delivery Note   Carrie Vasquez Bahl is a 32 y.o. G2P1001 at 3971w0d Estimated Date of Delivery: 10/19/17  PRE-OPERATIVE DIAGNOSIS:  1) 2471w0d pregnancy.   POST-OPERATIVE DIAGNOSIS:  1) 371w0d pregnancy s/p Vaginal, Spontaneous   Delivery Type: Vaginal, Spontaneous    Delivery Anesthesia: None   Labor Complications:    none    ESTIMATED BLOOD LOSS: 250 ml    FINDINGS:   1) female infant, Apgar scores of 7    at 1 minute and 8    at 5 minutes and a birthweight of 141.09  ounces. 8 lbs. 13 oz.    2) Nuchal cord: no  SPECIMENS:   PLACENTA:   Appearance: Intact , 3 vessel cord, cord blood sample collected   Removal: Spontaneous      Disposition:   held per protocol then discarded  DISPOSITION:  Infant to left in stable condition in the delivery room, with L&D personnel and mother,  NARRATIVE SUMMARY: Labor course:  Ms. Carrie Vasquez Rothenberger is a G2P1001 at 371w0d who presented for labor management.  She progressed well in labor without pitocin.  She received no epidural or iv medication and proceeded to complete dilation. She evidenced good maternal expulsive effort during the second stage. She went on to deliver a viable female infant "[name]" . The placenta delivered without problems and was noted to be complete. A perineal and vaginal examination was performed. Lacerations: 1st degree  perineal was repaired with a 3-0 Vicryl rapide suture using local anesthesia. The patient tolerated this well.  Doreene Burkennie Amrit Erck, CNM  [facility] 10/19/2017 12:02 PM

## 2017-08-12 ENCOUNTER — Ambulatory Visit (INDEPENDENT_AMBULATORY_CARE_PROVIDER_SITE_OTHER): Payer: Medicaid Other | Admitting: Certified Nurse Midwife

## 2017-08-12 ENCOUNTER — Encounter: Payer: Self-pay | Admitting: Certified Nurse Midwife

## 2017-08-12 VITALS — BP 123/81 | HR 78 | Wt 139.9 lb

## 2017-08-12 DIAGNOSIS — Z3403 Encounter for supervision of normal first pregnancy, third trimester: Secondary | ICD-10-CM

## 2017-08-12 LAB — POCT URINALYSIS DIPSTICK
Bilirubin, UA: NEGATIVE
Blood, UA: NEGATIVE
Glucose, UA: NEGATIVE
Ketones, UA: NEGATIVE
Leukocytes, UA: NEGATIVE
NITRITE UA: NEGATIVE
PH UA: 7 (ref 5.0–8.0)
PROTEIN UA: NEGATIVE
SPEC GRAV UA: 1.015 (ref 1.010–1.025)
UROBILINOGEN UA: 0.2 U/dL

## 2017-08-12 NOTE — Progress Notes (Signed)
Rob doing well. No complaints. PTL precautions reviewed. Follow up 1 wks  Doreene BurkeAnnie Cassey Hurrell, CNM

## 2017-08-12 NOTE — Progress Notes (Signed)
ROB- Pt states she is doing well, did have some pressure a few days ago

## 2017-08-12 NOTE — Patient Instructions (Signed)

## 2017-08-30 ENCOUNTER — Encounter: Payer: Medicaid Other | Admitting: Obstetrics and Gynecology

## 2017-09-02 ENCOUNTER — Ambulatory Visit (INDEPENDENT_AMBULATORY_CARE_PROVIDER_SITE_OTHER): Payer: Medicaid Other | Admitting: Certified Nurse Midwife

## 2017-09-02 ENCOUNTER — Encounter: Payer: Self-pay | Admitting: Certified Nurse Midwife

## 2017-09-02 VITALS — BP 128/77 | HR 89 | Wt 141.2 lb

## 2017-09-02 DIAGNOSIS — Z3403 Encounter for supervision of normal first pregnancy, third trimester: Secondary | ICD-10-CM

## 2017-09-02 LAB — POCT URINALYSIS DIPSTICK
Bilirubin, UA: NEGATIVE
Glucose, UA: NEGATIVE
Ketones, UA: NEGATIVE
Leukocytes, UA: NEGATIVE
Nitrite, UA: NEGATIVE
PH UA: 7 (ref 5.0–8.0)
PROTEIN UA: NEGATIVE
RBC UA: NEGATIVE
Spec Grav, UA: 1.01 (ref 1.010–1.025)
Urobilinogen, UA: 0.2 E.U./dL

## 2017-09-02 NOTE — Progress Notes (Signed)
ROB- Pt states she is doing well 

## 2017-09-02 NOTE — Patient Instructions (Signed)
Vaginal Delivery Vaginal delivery means that you will give birth by pushing your baby out of your birth canal (vagina). A team of health care providers will help you before, during, and after vaginal delivery. Birth experiences are unique for every woman and every pregnancy, and birth experiences vary depending on where you choose to give birth. What should I do to prepare for my baby's birth? Before your baby is born, it is important to talk with your health care provider about:  Your labor and delivery preferences. These may include: ? Medicines that you may be given. ? How you will manage your pain. This might include non-medical pain relief techniques or injectable pain relief such as epidural analgesia. ? How you and your baby will be monitored during labor and delivery. ? Who may be in the labor and delivery room with you. ? Your feelings about surgical delivery of your baby (cesarean delivery, or C-section) if this becomes necessary. ? Your feelings about receiving donated blood through an IV tube (blood transfusion) if this becomes necessary.  Whether you are able: ? To take pictures or videos of the birth. ? To eat during labor and delivery. ? To move around, walk, or change positions during labor and delivery.  What to expect after your baby is born, such as: ? Whether delayed umbilical cord clamping and cutting is offered. ? Who will care for your baby right after birth. ? Medicines or tests that may be recommended for your baby. ? Whether breastfeeding is supported in your hospital or birth center. ? How long you will be in the hospital or birth center.  How any medical conditions you have may affect your baby or your labor and delivery experience.  To prepare for your baby's birth, you should also:  Attend all of your health care visits before delivery (prenatal visits) as recommended by your health care provider. This is important.  Prepare your home for your baby's  arrival. Make sure that you have: ? Diapers. ? Baby clothing. ? Feeding equipment. ? Safe sleeping arrangements for you and your baby.  Install a car seat in your vehicle. Have your car seat checked by a certified car seat installer to make sure that it is installed safely.  Think about who will help you with your new baby at home for at least the first several weeks after delivery.  What can I expect when I arrive at the birth center or hospital? Once you are in labor and have been admitted into the hospital or birth center, your health care provider may:  Review your pregnancy history and any concerns you have.  Insert an IV tube into one of your veins. This is used to give you fluids and medicines.  Check your blood pressure, pulse, temperature, and heart rate (vital signs).  Check whether your bag of water (amniotic sac) has broken (ruptured).  Talk with you about your birth plan and discuss pain control options.  Monitoring Your health care provider may monitor your contractions (uterine monitoring) and your baby's heart rate (fetal monitoring). You may need to be monitored:  Often, but not continuously (intermittently).  All the time or for long periods at a time (continuously). Continuous monitoring may be needed if: ? You are taking certain medicines, such as medicine to relieve pain or make your contractions stronger. ? You have pregnancy or labor complications.  Monitoring may be done by:  Placing a special stethoscope or a handheld monitoring device on your abdomen to   check your baby's heartbeat, and feeling your abdomen for contractions. This method of monitoring does not continuously record your baby's heartbeat or your contractions.  Placing monitors on your abdomen (external monitors) to record your baby's heartbeat and the frequency and length of contractions. You may not have to wear external monitors all the time.  Placing monitors inside of your uterus  (internal monitors) to record your baby's heartbeat and the frequency, length, and strength of your contractions. ? Your health care provider may use internal monitors if he or she needs more information about the strength of your contractions or your baby's heart rate. ? Internal monitors are put in place by passing a thin, flexible wire through your vagina and into your uterus. Depending on the type of monitor, it may remain in your uterus or on your baby's head until birth. ? Your health care provider will discuss the benefits and risks of internal monitoring with you and will ask for your permission before inserting the monitors.  Telemetry. This is a type of continuous monitoring that can be done with external or internal monitors. Instead of having to stay in bed, you are able to move around during telemetry. Ask your health care provider if telemetry is an option for you.  Physical exam Your health care provider may perform a physical exam. This may include:  Checking whether your baby is positioned: ? With the head toward your vagina (head-down). This is most common. ? With the head toward the top of your uterus (head-up or breech). If your baby is in a breech position, your health care provider may try to turn your baby to a head-down position so you can deliver vaginally. If it does not seem that your baby can be born vaginally, your provider may recommend surgery to deliver your baby. In rare cases, you may be able to deliver vaginally if your baby is head-up (breech delivery). ? Lying sideways (transverse). Babies that are lying sideways cannot be delivered vaginally.  Checking your cervix to determine: ? Whether it is thinning out (effacing). ? Whether it is opening up (dilating). ? How low your baby has moved into your birth canal.  What are the three stages of labor and delivery?  Normal labor and delivery is divided into the following three stages: Stage 1  Stage 1 is the  longest stage of labor, and it can last for hours or days. Stage 1 includes: ? Early labor. This is when contractions may be irregular, or regular and mild. Generally, early labor contractions are more than 10 minutes apart. ? Active labor. This is when contractions get longer, more regular, more frequent, and more intense. ? The transition phase. This is when contractions happen very close together, are very intense, and may last longer than during any other part of labor.  Contractions generally feel mild, infrequent, and irregular at first. They get stronger, more frequent (about every 2-3 minutes), and more regular as you progress from early labor through active labor and transition.  Many women progress through stage 1 naturally, but you may need help to continue making progress. If this happens, your health care provider may talk with you about: ? Rupturing your amniotic sac if it has not ruptured yet. ? Giving you medicine to help make your contractions stronger and more frequent.  Stage 1 ends when your cervix is completely dilated to 4 inches (10 cm) and completely effaced. This happens at the end of the transition phase. Stage 2  Once   your cervix is completely effaced and dilated to 4 inches (10 cm), you may start to feel an urge to push. It is common for the body to naturally take a rest before feeling the urge to push, especially if you received an epidural or certain other pain medicines. This rest period may last for up to 1-2 hours, depending on your unique labor experience.  During stage 2, contractions are generally less painful, because pushing helps relieve contraction pain. Instead of contraction pain, you may feel stretching and burning pain, especially when the widest part of your baby's head passes through the vaginal opening (crowning).  Your health care provider will closely monitor your pushing progress and your baby's progress through the vagina during stage 2.  Your  health care provider may massage the area of skin between your vaginal opening and anus (perineum) or apply warm compresses to your perineum. This helps it stretch as the baby's head starts to crown, which can help prevent perineal tearing. ? In some cases, an incision may be made in your perineum (episiotomy) to allow the baby to pass through the vaginal opening. An episiotomy helps to make the opening of the vagina larger to allow more room for the baby to fit through.  It is very important to breathe and focus so your health care provider can control the delivery of your baby's head. Your health care provider may have you decrease the intensity of your pushing, to help prevent perineal tearing.  After delivery of your baby's head, the shoulders and the rest of the body generally deliver very quickly and without difficulty.  Once your baby is delivered, the umbilical cord may be cut right away, or this may be delayed for 1-2 minutes, depending on your baby's health. This may vary among health care providers, hospitals, and birth centers.  If you and your baby are healthy enough, your baby may be placed on your chest or abdomen to help maintain the baby's temperature and to help you bond with each other. Some mothers and babies start breastfeeding at this time. Your health care team will dry your baby and help keep your baby warm during this time.  Your baby may need immediate care if he or she: ? Showed signs of distress during labor. ? Has a medical condition. ? Was born too early (prematurely). ? Had a bowel movement before birth (meconium). ? Shows signs of difficulty transitioning from being inside the uterus to being outside of the uterus. If you are planning to breastfeed, your health care team will help you begin a feeding. Stage 3  The third stage of labor starts immediately after the birth of your baby and ends after you deliver the placenta. The placenta is an organ that develops  during pregnancy to provide oxygen and nutrients to your baby in the womb.  Delivering the placenta may require some pushing, and you may have mild contractions. Breastfeeding can stimulate contractions to help you deliver the placenta.  After the placenta is delivered, your uterus should tighten (contract) and become firm. This helps to stop bleeding in your uterus. To help your uterus contract and to control bleeding, your health care provider may: ? Give you medicine by injection, through an IV tube, by mouth, or through your rectum (rectally). ? Massage your abdomen or perform a vaginal exam to remove any blood clots that are left in your uterus. ? Empty your bladder by placing a thin, flexible tube (catheter) into your bladder. ? Encourage   you to breastfeed your baby. After labor is over, you and your baby will be monitored closely to ensure that you are both healthy until you are ready to go home. Your health care team will teach you how to care for yourself and your baby. This information is not intended to replace advice given to you by your health care provider. Make sure you discuss any questions you have with your health care provider. Document Released: 05/04/2008 Document Revised: 02/13/2016 Document Reviewed: 08/10/2015 Elsevier Interactive Patient Education  2018 [employer] of the uterus can occur throughout pregnancy, but they are not always a sign that you are in labor. You may have practice contractions called Braxton Hicks contractions. These false labor contractions are sometimes confused with true labor. What are [place] contractions? Braxton Hicks contractions are tightening movements that occur in the muscles of the uterus before labor. Unlike true labor contractions, these contractions do not result in opening (dilation) and thinning of the cervix. Toward the end of pregnancy (32-34 weeks), Braxton Hicks contractions can happen  more often and may become stronger. These contractions are sometimes difficult to tell apart from true labor because they can be very uncomfortable. You should not feel embarrassed if you go to the hospital with false labor. Sometimes, the only way to tell if you are in true labor is for your health care provider to look for changes in the cervix. The health care provider will do a physical exam and may monitor your contractions. If you are not in true labor, the exam should show that your cervix is not dilating and your water has not broken. If there are other health problems associated with your pregnancy, it is completely safe for you to be sent home with false labor. You may continue to have Braxton Hicks contractions until you go into true labor. How to tell the difference between true labor and false labor True labor  Contractions last 30-70 seconds.  Contractions become very regular.  Discomfort is usually felt in the top of the uterus, and it spreads to the lower abdomen and low back.  Contractions do not go away with walking.  Contractions usually become more intense and increase in frequency.  The cervix dilates and gets thinner. False labor  Contractions are usually shorter and not as strong as true labor contractions.  Contractions are usually irregular.  Contractions are often felt in the front of the lower abdomen and in the groin.  Contractions may go away when you walk around or change positions while lying down.  Contractions get weaker and are shorter-lasting as time goes on.  The cervix usually does not dilate or become thin. Follow these instructions at home:  Take over-the-counter and prescription medicines only as told by your health care provider.  Keep up with your usual exercises and follow other instructions from your health care provider.  Eat and drink lightly if you think you are going into labor.  If Braxton Hicks contractions are making you  uncomfortable: ? Change your position from lying down or resting to walking, or change from walking to resting. ? Sit and rest in a tub of warm water. ? Drink enough fluid to keep your urine pale yellow. Dehydration may cause these contractions. ? Do slow and deep breathing several times an hour.  Keep all follow-up prenatal visits as told by your health care provider. This is important. Contact a health care provider if:  You have a fever.  You have continuous pain in your abdomen. Get help right away if:  Your contractions become stronger, more regular, and closer together.  You have fluid leaking or gushing from your vagina.  You pass blood-tinged mucus (bloody show).  You have bleeding from your vagina.  You have low back pain that you never had before.  You feel your baby's head pushing down and causing pelvic pressure.  Your baby is not moving inside you as much as it used to. Summary  Contractions that occur before labor are called Braxton Hicks contractions, false labor, or practice contractions.  Braxton Hicks contractions are usually shorter, weaker, farther apart, and less regular than true labor contractions. True labor contractions usually become progressively stronger and regular and they become more frequent.  Manage discomfort from Braxton Hicks contractions by changing position, resting in a warm bath, drinking plenty of water, or practicing deep breathing. This information is not intended to replace advice given to you by your health care provider. Make sure you discuss any questions you have with your health care provider. Document Released: 12/09/2016 Document Revised: 12/09/2016 Document Reviewed: 12/09/2016 Elsevier Interactive Patient Education  2018 [employer]  

## 2017-09-03 NOTE — Progress Notes (Signed)
ROB-Considering hydrotherapy is labor. Schedule for class on 09/28/2017. Advised to bring certificate of completion. 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 Reviewed red flag symptoms and when to call. RTC x 2-3 weeks for 36 week cultures and ROB or sooner if needed.

## 2017-09-23 ENCOUNTER — Encounter: Payer: Medicaid Other | Admitting: Certified Nurse Midwife

## 2017-09-26 ENCOUNTER — Ambulatory Visit (INDEPENDENT_AMBULATORY_CARE_PROVIDER_SITE_OTHER): Payer: Medicaid Other | Admitting: Certified Nurse Midwife

## 2017-09-26 VITALS — BP 120/77 | HR 87 | Wt 145.5 lb

## 2017-09-26 DIAGNOSIS — Z113 Encounter for screening for infections with a predominantly sexual mode of transmission: Secondary | ICD-10-CM

## 2017-09-26 DIAGNOSIS — Z3685 Encounter for antenatal screening for Streptococcus B: Secondary | ICD-10-CM

## 2017-09-26 DIAGNOSIS — Z3493 Encounter for supervision of normal pregnancy, unspecified, third trimester: Secondary | ICD-10-CM

## 2017-09-26 LAB — POCT URINALYSIS DIPSTICK
BILIRUBIN UA: NEGATIVE
Blood, UA: NEGATIVE
Glucose, UA: NEGATIVE
Ketones, UA: NEGATIVE
Leukocytes, UA: NEGATIVE
NITRITE UA: NEGATIVE
PH UA: 6 (ref 5.0–8.0)
PROTEIN UA: NEGATIVE
Spec Grav, UA: 1.02 (ref 1.010–1.025)
Urobilinogen, UA: 0.2 E.U./dL

## 2017-09-26 MED ORDER — VALACYCLOVIR HCL 500 MG PO TABS: 500.0000 mg | ORAL_TABLET | Freq: Two times a day (BID) | ORAL | 6 refills | Status: DC

## 2017-09-26 NOTE — Progress Notes (Signed)
ROB- cultures obtained today, pt is doing well 

## 2017-09-26 NOTE — Patient Instructions (Signed)
Vaginal Delivery Vaginal delivery means that you will give birth by pushing your baby out of your birth canal (vagina). A team of health care providers will help you before, during, and after vaginal delivery. Birth experiences are unique for every woman and every pregnancy, and birth experiences vary depending on where you choose to give birth. What should I do to prepare for my baby's birth? Before your baby is born, it is important to talk with your health care provider about:  Your labor and delivery preferences. These may include: ? Medicines that you may be given. ? How you will manage your pain. This might include non-medical pain relief techniques or injectable pain relief such as epidural analgesia. ? How you and your baby will be monitored during labor and delivery. ? Who may be in the labor and delivery room with you. ? Your feelings about surgical delivery of your baby (cesarean delivery, or C-section) if this becomes necessary. ? Your feelings about receiving donated blood through an IV tube (blood transfusion) if this becomes necessary.  Whether you are able: ? To take pictures or videos of the birth. ? To eat during labor and delivery. ? To move around, walk, or change positions during labor and delivery.  What to expect after your baby is born, such as: ? Whether delayed umbilical cord clamping and cutting is offered. ? Who will care for your baby right after birth. ? Medicines or tests that may be recommended for your baby. ? Whether breastfeeding is supported in your hospital or birth center. ? How long you will be in the hospital or birth center.  How any medical conditions you have may affect your baby or your labor and delivery experience.  To prepare for your baby's birth, you should also:  Attend all of your health care visits before delivery (prenatal visits) as recommended by your health care provider. This is important.  Prepare your home for your baby's  arrival. Make sure that you have: ? Diapers. ? Baby clothing. ? Feeding equipment. ? Safe sleeping arrangements for you and your baby.  Install a car seat in your vehicle. Have your car seat checked by a certified car seat installer to make sure that it is installed safely.  Think about who will help you with your new baby at home for at least the first several weeks after delivery.  What can I expect when I arrive at the birth center or hospital? Once you are in labor and have been admitted into the hospital or birth center, your health care provider may:  Review your pregnancy history and any concerns you have.  Insert an IV tube into one of your veins. This is used to give you fluids and medicines.  Check your blood pressure, pulse, temperature, and heart rate (vital signs).  Check whether your bag of water (amniotic sac) has broken (ruptured).  Talk with you about your birth plan and discuss pain control options.  Monitoring Your health care provider may monitor your contractions (uterine monitoring) and your baby's heart rate (fetal monitoring). You may need to be monitored:  Often, but not continuously (intermittently).  All the time or for long periods at a time (continuously). Continuous monitoring may be needed if: ? You are taking certain medicines, such as medicine to relieve pain or make your contractions stronger. ? You have pregnancy or labor complications.  Monitoring may be done by:  Placing a special stethoscope or a handheld monitoring device on your abdomen to   check your baby's heartbeat, and feeling your abdomen for contractions. This method of monitoring does not continuously record your baby's heartbeat or your contractions.  Placing monitors on your abdomen (external monitors) to record your baby's heartbeat and the frequency and length of contractions. You may not have to wear external monitors all the time.  Placing monitors inside of your uterus  (internal monitors) to record your baby's heartbeat and the frequency, length, and strength of your contractions. ? Your health care provider may use internal monitors if he or she needs more information about the strength of your contractions or your baby's heart rate. ? Internal monitors are put in place by passing a thin, flexible wire through your vagina and into your uterus. Depending on the type of monitor, it may remain in your uterus or on your baby's head until birth. ? Your health care provider will discuss the benefits and risks of internal monitoring with you and will ask for your permission before inserting the monitors.  Telemetry. This is a type of continuous monitoring that can be done with external or internal monitors. Instead of having to stay in bed, you are able to move around during telemetry. Ask your health care provider if telemetry is an option for you.  Physical exam Your health care provider may perform a physical exam. This may include:  Checking whether your baby is positioned: ? With the head toward your vagina (head-down). This is most common. ? With the head toward the top of your uterus (head-up or breech). If your baby is in a breech position, your health care provider may try to turn your baby to a head-down position so you can deliver vaginally. If it does not seem that your baby can be born vaginally, your provider may recommend surgery to deliver your baby. In rare cases, you may be able to deliver vaginally if your baby is head-up (breech delivery). ? Lying sideways (transverse). Babies that are lying sideways cannot be delivered vaginally.  Checking your cervix to determine: ? Whether it is thinning out (effacing). ? Whether it is opening up (dilating). ? How low your baby has moved into your birth canal.  What are the three stages of labor and delivery?  Normal labor and delivery is divided into the following three stages: Stage 1  Stage 1 is the  longest stage of labor, and it can last for hours or days. Stage 1 includes: ? Early labor. This is when contractions may be irregular, or regular and mild. Generally, early labor contractions are more than 10 minutes apart. ? Active labor. This is when contractions get longer, more regular, more frequent, and more intense. ? The transition phase. This is when contractions happen very close together, are very intense, and may last longer than during any other part of labor.  Contractions generally feel mild, infrequent, and irregular at first. They get stronger, more frequent (about every 2-3 minutes), and more regular as you progress from early labor through active labor and transition.  Many women progress through stage 1 naturally, but you may need help to continue making progress. If this happens, your health care provider may talk with you about: ? Rupturing your amniotic sac if it has not ruptured yet. ? Giving you medicine to help make your contractions stronger and more frequent.  Stage 1 ends when your cervix is completely dilated to 4 inches (10 cm) and completely effaced. This happens at the end of the transition phase. Stage 2  Once   your cervix is completely effaced and dilated to 4 inches (10 cm), you may start to feel an urge to push. It is common for the body to naturally take a rest before feeling the urge to push, especially if you received an epidural or certain other pain medicines. This rest period may last for up to 1-2 hours, depending on your unique labor experience.  During stage 2, contractions are generally less painful, because pushing helps relieve contraction pain. Instead of contraction pain, you may feel stretching and burning pain, especially when the widest part of your baby's head passes through the vaginal opening (crowning).  Your health care provider will closely monitor your pushing progress and your baby's progress through the vagina during stage 2.  Your  health care provider may massage the area of skin between your vaginal opening and anus (perineum) or apply warm compresses to your perineum. This helps it stretch as the baby's head starts to crown, which can help prevent perineal tearing. ? In some cases, an incision may be made in your perineum (episiotomy) to allow the baby to pass through the vaginal opening. An episiotomy helps to make the opening of the vagina larger to allow more room for the baby to fit through.  It is very important to breathe and focus so your health care provider can control the delivery of your baby's head. Your health care provider may have you decrease the intensity of your pushing, to help prevent perineal tearing.  After delivery of your baby's head, the shoulders and the rest of the body generally deliver very quickly and without difficulty.  Once your baby is delivered, the umbilical cord may be cut right away, or this may be delayed for 1-2 minutes, depending on your baby's health. This may vary among health care providers, hospitals, and birth centers.  If you and your baby are healthy enough, your baby may be placed on your chest or abdomen to help maintain the baby's temperature and to help you bond with each other. Some mothers and babies start breastfeeding at this time. Your health care team will dry your baby and help keep your baby warm during this time.  Your baby may need immediate care if he or she: ? Showed signs of distress during labor. ? Has a medical condition. ? Was born too early (prematurely). ? Had a bowel movement before birth (meconium). ? Shows signs of difficulty transitioning from being inside the uterus to being outside of the uterus. If you are planning to breastfeed, your health care team will help you begin a feeding. Stage 3  The third stage of labor starts immediately after the birth of your baby and ends after you deliver the placenta. The placenta is an organ that develops  during pregnancy to provide oxygen and nutrients to your baby in the womb.  Delivering the placenta may require some pushing, and you may have mild contractions. Breastfeeding can stimulate contractions to help you deliver the placenta.  After the placenta is delivered, your uterus should tighten (contract) and become firm. This helps to stop bleeding in your uterus. To help your uterus contract and to control bleeding, your health care provider may: ? Give you medicine by injection, through an IV tube, by mouth, or through your rectum (rectally). ? Massage your abdomen or perform a vaginal exam to remove any blood clots that are left in your uterus. ? Empty your bladder by placing a thin, flexible tube (catheter) into your bladder. ? Encourage   you to breastfeed your baby. After labor is over, you and your baby will be monitored closely to ensure that you are both healthy until you are ready to go home. Your health care team will teach you how to care for yourself and your baby. This information is not intended to replace advice given to you by your health care provider. Make sure you discuss any questions you have with your health care provider. Document Released: 05/04/2008 Document Revised: 02/13/2016 Document Reviewed: 08/10/2015 Elsevier Interactive Patient Education  2018 [employer]  

## 2017-09-27 NOTE — Progress Notes (Signed)
ROB-Reports braxton hick contractions and irregular sciatica pain. Discussed home treatment measures for typical discomforts of pregnancy. 36 cultures collected. Rx: Valtrex, see orders. Reviewed red flag symptoms and when to call. RTC x 1 week for ROB or sooner if needed.

## 2017-09-28 LAB — GC/CHLAMYDIA PROBE AMP
CHLAMYDIA, DNA PROBE: NEGATIVE
NEISSERIA GONORRHOEAE BY PCR: NEGATIVE

## 2017-09-28 LAB — STREP GP B NAA: Strep Gp B NAA: NEGATIVE

## 2017-10-03 ENCOUNTER — Ambulatory Visit (INDEPENDENT_AMBULATORY_CARE_PROVIDER_SITE_OTHER): Payer: Medicaid Other | Admitting: Certified Nurse Midwife

## 2017-10-03 ENCOUNTER — Encounter: Payer: Self-pay | Admitting: Certified Nurse Midwife

## 2017-10-03 VITALS — BP 112/72 | HR 72 | Wt 145.1 lb

## 2017-10-03 DIAGNOSIS — Z3493 Encounter for supervision of normal pregnancy, unspecified, third trimester: Secondary | ICD-10-CM

## 2017-10-03 LAB — POCT URINALYSIS DIPSTICK
Bilirubin, UA: NEGATIVE
Blood, UA: NEGATIVE
Glucose, UA: NEGATIVE
KETONES UA: NEGATIVE
Leukocytes, UA: NEGATIVE
NITRITE UA: NEGATIVE
PH UA: 5 (ref 5.0–8.0)
Protein, UA: NEGATIVE
SPEC GRAV UA: 1.02 (ref 1.010–1.025)
UROBILINOGEN UA: 0.2 U/dL

## 2017-10-03 NOTE — Progress Notes (Signed)
Pt is here for an ROB visit.c/o sciatic issues.

## 2017-10-03 NOTE — Progress Notes (Signed)
ROB doing well. Feels good movement. Has occasional contractions. Labor precautions reviewed. Follow up 1 wk.   Doreene BurkeAnnie Genaro Bekker, CNM

## 2017-10-03 NOTE — Patient Instructions (Signed)
Braxton Hicks Contractions °Contractions of the uterus can occur throughout pregnancy, but they are not always a sign that you are in labor. You may have practice contractions called Braxton Hicks contractions. These false labor contractions are sometimes confused with true labor. °What are Braxton Hicks contractions? °Braxton Hicks contractions are tightening movements that occur in the muscles of the uterus before labor. Unlike true labor contractions, these contractions do not result in opening (dilation) and thinning of the cervix. Toward the end of pregnancy (32-34 weeks), Braxton Hicks contractions can happen more often and may become stronger. These contractions are sometimes difficult to tell apart from true labor because they can be very uncomfortable. You should not feel embarrassed if you go to the hospital with false labor. °Sometimes, the only way to tell if you are in true labor is for your health care provider to look for changes in the cervix. The health care provider will do a physical exam and may monitor your contractions. If you are not in true labor, the exam should show that your cervix is not dilating and your water has not broken. °If there are other health problems associated with your pregnancy, it is completely safe for you to be sent home with false labor. You may continue to have Braxton Hicks contractions until you go into true labor. °How to tell the difference between true labor and false labor °True labor °· Contractions last 30-70 seconds. °· Contractions become very regular. °· Discomfort is usually felt in the top of the uterus, and it spreads to the lower abdomen and low back. °· Contractions do not go away with walking. °· Contractions usually become more intense and increase in frequency. °· The cervix dilates and gets thinner. °False labor °· Contractions are usually shorter and not as strong as true labor contractions. °· Contractions are usually irregular. °· Contractions  are often felt in the front of the lower abdomen and in the groin. °· Contractions may go away when you walk around or change positions while lying down. °· Contractions get weaker and are shorter-lasting as time goes on. °· The cervix usually does not dilate or become thin. °Follow these instructions at home: °· Take over-the-counter and prescription medicines only as told by your health care provider. °· Keep up with your usual exercises and follow other instructions from your health care provider. °· Eat and drink lightly if you think you are going into labor. °· If Braxton Hicks contractions are making you uncomfortable: °? Change your position from lying down or resting to walking, or change from walking to resting. °? Sit and rest in a tub of warm water. °? Drink enough fluid to keep your urine pale yellow. Dehydration may cause these contractions. °? Do slow and deep breathing several times an hour. °· Keep all follow-up prenatal visits as told by your health care provider. This is important. °Contact a health care provider if: °· You have a fever. °· You have continuous pain in your abdomen. °Get help right away if: °· Your contractions become stronger, more regular, and closer together. °· You have fluid leaking or gushing from your vagina. °· You pass blood-tinged mucus (bloody show). °· You have bleeding from your vagina. °· You have low back pain that you never had before. °· You feel your baby’s head pushing down and causing pelvic pressure. °· Your baby is not moving inside you as much as it used to. °Summary °· Contractions that occur before labor are called Braxton   Hicks contractions, false labor, or practice contractions. °· Braxton Hicks contractions are usually shorter, weaker, farther apart, and less regular than true labor contractions. True labor contractions usually become progressively stronger and regular and they become more frequent. °· Manage discomfort from Braxton Hicks contractions by  changing position, resting in a warm bath, drinking plenty of water, or practicing deep breathing. °This information is not intended to replace advice given to you by your health care provider. Make sure you discuss any questions you have with your health care provider. °Document Released: 12/09/2016 Document Revised: 12/09/2016 Document Reviewed: 12/09/2016 °Elsevier Interactive Patient Education © 2018 [employer] Inc[employer] ° °

## 2017-10-10 ENCOUNTER — Ambulatory Visit (INDEPENDENT_AMBULATORY_CARE_PROVIDER_SITE_OTHER): Payer: Medicaid Other | Admitting: Certified Nurse Midwife

## 2017-10-10 VITALS — BP 123/75 | HR 78 | Wt 147.0 lb

## 2017-10-10 DIAGNOSIS — Z3483 Encounter for supervision of other normal pregnancy, third trimester: Secondary | ICD-10-CM

## 2017-10-10 LAB — POCT URINALYSIS DIPSTICK
BILIRUBIN UA: NEGATIVE
Blood, UA: NEGATIVE
GLUCOSE UA: NEGATIVE
KETONES UA: NEGATIVE
Leukocytes, UA: NEGATIVE
Nitrite, UA: NEGATIVE
Protein, UA: NEGATIVE
Spec Grav, UA: 1.01 (ref 1.010–1.025)
Urobilinogen, UA: 0.2 E.U./dL
pH, UA: 6.5 (ref 5.0–8.0)

## 2017-10-10 NOTE — Progress Notes (Signed)
ROB doing well. States she had increased vaginal discharge this morning. Nitrazine negative, no fluid visualized SVE 1 cm. Feels good fetal movement. Discussed induction at 41 wks. She is not interested in induction stating that she had a bad experience with her 1st induction. Discussed risk and benefits and antenatal testing to insure fetal well being. She will follow up in 1 wk . Labor precautions reviewed.   Doreene BurkeAnnie Jelani Trueba, CNM

## 2017-10-10 NOTE — Patient Instructions (Signed)
Braxton Hicks Contractions °Contractions of the uterus can occur throughout pregnancy, but they are not always a sign that you are in labor. You may have practice contractions called Braxton Hicks contractions. These false labor contractions are sometimes confused with true labor. °What are Braxton Hicks contractions? °Braxton Hicks contractions are tightening movements that occur in the muscles of the uterus before labor. Unlike true labor contractions, these contractions do not result in opening (dilation) and thinning of the cervix. Toward the end of pregnancy (32-34 weeks), Braxton Hicks contractions can happen more often and may become stronger. These contractions are sometimes difficult to tell apart from true labor because they can be very uncomfortable. You should not feel embarrassed if you go to the hospital with false labor. °Sometimes, the only way to tell if you are in true labor is for your health care provider to look for changes in the cervix. The health care provider will do a physical exam and may monitor your contractions. If you are not in true labor, the exam should show that your cervix is not dilating and your water has not broken. °If there are other health problems associated with your pregnancy, it is completely safe for you to be sent home with false labor. You may continue to have Braxton Hicks contractions until you go into true labor. °How to tell the difference between true labor and false labor °True labor °· Contractions last 30-70 seconds. °· Contractions become very regular. °· Discomfort is usually felt in the top of the uterus, and it spreads to the lower abdomen and low back. °· Contractions do not go away with walking. °· Contractions usually become more intense and increase in frequency. °· The cervix dilates and gets thinner. °False labor °· Contractions are usually shorter and not as strong as true labor contractions. °· Contractions are usually irregular. °· Contractions  are often felt in the front of the lower abdomen and in the groin. °· Contractions may go away when you walk around or change positions while lying down. °· Contractions get weaker and are shorter-lasting as time goes on. °· The cervix usually does not dilate or become thin. °Follow these instructions at home: °· Take over-the-counter and prescription medicines only as told by your health care provider. °· Keep up with your usual exercises and follow other instructions from your health care provider. °· Eat and drink lightly if you think you are going into labor. °· If Braxton Hicks contractions are making you uncomfortable: °? Change your position from lying down or resting to walking, or change from walking to resting. °? Sit and rest in a tub of warm water. °? Drink enough fluid to keep your urine pale yellow. Dehydration may cause these contractions. °? Do slow and deep breathing several times an hour. °· Keep all follow-up prenatal visits as told by your health care provider. This is important. °Contact a health care provider if: °· You have a fever. °· You have continuous pain in your abdomen. °Get help right away if: °· Your contractions become stronger, more regular, and closer together. °· You have fluid leaking or gushing from your vagina. °· You pass blood-tinged mucus (bloody show). °· You have bleeding from your vagina. °· You have low back pain that you never had before. °· You feel your baby’s head pushing down and causing pelvic pressure. °· Your baby is not moving inside you as much as it used to. °Summary °· Contractions that occur before labor are called Braxton   Hicks contractions, false labor, or practice contractions. °· Braxton Hicks contractions are usually shorter, weaker, farther apart, and less regular than true labor contractions. True labor contractions usually become progressively stronger and regular and they become more frequent. °· Manage discomfort from Braxton Hicks contractions by  changing position, resting in a warm bath, drinking plenty of water, or practicing deep breathing. °This information is not intended to replace advice given to you by your health care provider. Make sure you discuss any questions you have with your health care provider. °Document Released: 12/09/2016 Document Revised: 12/09/2016 Document Reviewed: 12/09/2016 °Elsevier Interactive Patient Education © 2018 [employer] Inc[employer] ° °

## 2017-10-10 NOTE — Progress Notes (Signed)
ROB- pt is doing well 

## 2017-10-19 ENCOUNTER — Other Ambulatory Visit: Payer: Self-pay

## 2017-10-19 ENCOUNTER — Inpatient Hospital Stay
Admission: EM | Admit: 2017-10-19 | Discharge: 2017-10-20 | DRG: 807 | Disposition: A | Discharge status: Home / Self Care | Payer: Medicaid Other | Attending: Certified Nurse Midwife | Admitting: Certified Nurse Midwife

## 2017-10-19 DIAGNOSIS — Z87891 Personal history of nicotine dependence: Secondary | ICD-10-CM | POA: Diagnosis not present

## 2017-10-19 DIAGNOSIS — Z3A40 40 weeks gestation of pregnancy: Secondary | ICD-10-CM

## 2017-10-19 DIAGNOSIS — Z8759 Personal history of other complications of pregnancy, childbirth and the puerperium: Secondary | ICD-10-CM

## 2017-10-19 DIAGNOSIS — Z3483 Encounter for supervision of other normal pregnancy, third trimester: Secondary | ICD-10-CM | POA: Diagnosis present

## 2017-10-19 LAB — CBC
HCT: 43 % (ref 35.0–47.0)
HEMOGLOBIN: 14.6 g/dL (ref 12.0–16.0)
MCH: 31.1 pg (ref 26.0–34.0)
MCHC: 34.1 g/dL (ref 32.0–36.0)
MCV: 91.3 fL (ref 80.0–100.0)
Platelets: 139 10*3/uL — ABNORMAL LOW (ref 150–440)
RBC: 4.7 MIL/uL (ref 3.80–5.20)
RDW: 14 % (ref 11.5–14.5)
WBC: 10.8 10*3/uL (ref 3.6–11.0)

## 2017-10-19 LAB — TYPE AND SCREEN
ABO/RH(D): O POS
ANTIBODY SCREEN: NEGATIVE

## 2017-10-19 MED ORDER — COCONUT OIL OIL
1.0000 | TOPICAL_OIL | Status: DC | PRN
Start: 2017-10-19 — End: 2017-10-20
  Administered 2017-10-19: 1 via TOPICAL
  Filled 2017-10-19: qty 120

## 2017-10-19 MED ORDER — OXYTOCIN 10 UNIT/ML IJ SOLN
INTRAMUSCULAR | Status: AC
  Filled 2017-10-19: qty 2

## 2017-10-19 MED ORDER — ONDANSETRON HCL 4 MG/2ML IJ SOLN: 4.0000 mg | Freq: Four times a day (QID) | INTRAMUSCULAR | Status: DC | PRN

## 2017-10-19 MED ORDER — BENZOCAINE-MENTHOL 20-0.5 % EX AERO
1.0000 "application " | INHALATION_SPRAY | CUTANEOUS | Status: DC | PRN
  Administered 2017-10-19: 1 via TOPICAL
  Filled 2017-10-19 (×2): qty 56

## 2017-10-19 MED ORDER — OXYCODONE-ACETAMINOPHEN 5-325 MG PO TABS
1.0000 | ORAL_TABLET | ORAL | Status: DC | PRN
  Administered 2017-10-19: 1 via ORAL
  Filled 2017-10-19: qty 1

## 2017-10-19 MED ORDER — PRENATAL MULTIVITAMIN CH
1.0000 | ORAL_TABLET | Freq: Every day | ORAL | Status: DC
  Administered 2017-10-19 – 2017-10-20 (×2): 1 via ORAL
  Filled 2017-10-19 (×2): qty 1

## 2017-10-19 MED ORDER — OXYTOCIN 10 UNIT/ML IJ SOLN: 10.0000 [IU] | Freq: Once | INTRAMUSCULAR | Status: DC

## 2017-10-19 MED ORDER — OXYCODONE-ACETAMINOPHEN 5-325 MG PO TABS: 2.0000 | ORAL_TABLET | ORAL | Status: DC | PRN

## 2017-10-19 MED ORDER — METHYLERGONOVINE MALEATE 0.2 MG/ML IJ SOLN: 0.2000 mg | INTRAMUSCULAR | Status: DC | PRN

## 2017-10-19 MED ORDER — MISOPROSTOL 200 MCG PO TABS
ORAL_TABLET | ORAL | Status: AC
  Filled 2017-10-19: qty 4

## 2017-10-19 MED ORDER — OXYTOCIN 40 UNITS IN LACTATED RINGERS INFUSION - SIMPLE MED
INTRAVENOUS | Status: AC
  Administered 2017-10-19: 500 mL/h
  Filled 2017-10-19: qty 1000

## 2017-10-19 MED ORDER — LACTATED RINGERS IV SOLN: 500.0000 mL | INTRAVENOUS | Status: DC | PRN

## 2017-10-19 MED ORDER — LIDOCAINE HCL (PF) 1 % IJ SOLN
INTRAMUSCULAR | Status: AC
  Administered 2017-10-19: 30 mL via SUBCUTANEOUS
  Filled 2017-10-19: qty 30

## 2017-10-19 MED ORDER — ACETAMINOPHEN 325 MG PO TABS
650.0000 mg | ORAL_TABLET | ORAL | Status: DC | PRN
  Administered 2017-10-19 – 2017-10-20 (×2): 650 mg via ORAL
  Filled 2017-10-19: qty 2

## 2017-10-19 MED ORDER — ACETAMINOPHEN 325 MG PO TABS
650.0000 mg | ORAL_TABLET | ORAL | Status: DC | PRN
  Filled 2017-10-19: qty 2

## 2017-10-19 MED ORDER — DIBUCAINE 1 % RE OINT
1.0000 "application " | TOPICAL_OINTMENT | RECTAL | Status: DC | PRN
  Filled 2017-10-19: qty 28

## 2017-10-19 MED ORDER — BUTORPHANOL TARTRATE 1 MG/ML IJ SOLN: 1.0000 mg | INTRAMUSCULAR | Status: DC | PRN

## 2017-10-19 MED ORDER — SODIUM CHLORIDE 0.9 % IV SOLN: INTRAVENOUS | Status: DC

## 2017-10-19 MED ORDER — SIMETHICONE 80 MG PO CHEW: 80.0000 mg | CHEWABLE_TABLET | ORAL | Status: DC | PRN

## 2017-10-19 MED ORDER — SENNOSIDES-DOCUSATE SODIUM 8.6-50 MG PO TABS
2.0000 | ORAL_TABLET | ORAL | Status: DC
  Administered 2017-10-20: 2 via ORAL
  Filled 2017-10-19: qty 2

## 2017-10-19 MED ORDER — METHYLERGONOVINE MALEATE 0.2 MG PO TABS: 0.2000 mg | ORAL_TABLET | ORAL | Status: DC | PRN

## 2017-10-19 MED ORDER — TETANUS-DIPHTH-ACELL PERTUSSIS 5-2.5-18.5 LF-MCG/0.5 IM SUSP: 0.5000 mL | Freq: Once | INTRAMUSCULAR | Status: DC

## 2017-10-19 MED ORDER — WITCH HAZEL-GLYCERIN EX PADS
1.0000 "application " | MEDICATED_PAD | CUTANEOUS | Status: DC | PRN
  Filled 2017-10-19: qty 100

## 2017-10-19 MED ORDER — ONDANSETRON HCL 4 MG/2ML IJ SOLN: 4.0000 mg | INTRAMUSCULAR | Status: DC | PRN

## 2017-10-19 MED ORDER — DOCUSATE SODIUM 100 MG PO CAPS
100.0000 mg | ORAL_CAPSULE | Freq: Two times a day (BID) | ORAL | Status: DC
  Administered 2017-10-20 (×2): 100 mg via ORAL
  Filled 2017-10-19 (×2): qty 1

## 2017-10-19 MED ORDER — LIDOCAINE HCL (PF) 1 % IJ SOLN
30.0000 mL | INTRAMUSCULAR | Status: DC | PRN
  Administered 2017-10-19: 30 mL via SUBCUTANEOUS

## 2017-10-19 MED ORDER — AMMONIA AROMATIC IN INHA
RESPIRATORY_TRACT | Status: AC
  Filled 2017-10-19: qty 10

## 2017-10-19 MED ORDER — FERROUS SULFATE 325 (65 FE) MG PO TABS
325.0000 mg | ORAL_TABLET | Freq: Every day | ORAL | Status: DC
  Administered 2017-10-20: 325 mg via ORAL
  Filled 2017-10-19: qty 1

## 2017-10-19 MED ORDER — DIPHENHYDRAMINE HCL 25 MG PO CAPS: 25.0000 mg | ORAL_CAPSULE | Freq: Four times a day (QID) | ORAL | Status: DC | PRN

## 2017-10-19 MED ORDER — ONDANSETRON HCL 4 MG PO TABS: 4.0000 mg | ORAL_TABLET | ORAL | Status: DC | PRN

## 2017-10-19 MED ORDER — IBUPROFEN 600 MG PO TABS
600.0000 mg | ORAL_TABLET | Freq: Four times a day (QID) | ORAL | Status: DC
  Administered 2017-10-19 – 2017-10-20 (×5): 600 mg via ORAL
  Filled 2017-10-19 (×5): qty 1

## 2017-10-19 MED ORDER — SOD CITRATE-CITRIC ACID 500-334 MG/5ML PO SOLN: 30.0000 mL | ORAL | Status: DC | PRN

## 2017-10-19 NOTE — H&P (Signed)
History and Physical   HPI  Carrie Vasquez is a 32 y.o. G2P1001 at [redacted]w[redacted]d Estimated Date of Delivery: 10/19/17 who is being admitted for labor management   OB History  Obstetric History   G2   P1   T1   P0   A0   L1    SAB0   TAB0   Ectopic0   Multiple0   Live Births1     # Outcome Date GA Lbr Len/2nd Weight Sex Delivery Anes PTL Lv  2 Current           1 Term 10/20/15 [redacted]w[redacted]d 05:00 / 02:38 8 lb 5 oz (3.77 kg) F Vag-Spont EPI  LIV     Name: Carrie Vasquez      PROBLEM LIST  Pregnancy complications or risks: Patient Active Problem List   Diagnosis Date Noted  . Labor and delivery, indication for care 10/19/2017  . History of postpartum depression, currently pregnant 04/17/2017  . Maternal varicella, non-immune 03/26/2017  . Rash and nonspecific skin eruption 01/19/2017  . Anxiety and depression 01/19/2017  . History of gestational hypertension 10/18/2015  . Tobacco abuse 01/20/2015    Prenatal labs and studies: ABO, Rh: --/--/O POS (07/13 2008) Antibody: Negative (08/17 1617) Rubella: 2.11 (08/17 1617) RPR: Non Reactive (12/20 1434)  HBsAg: Negative (08/17 1617)  HIV: Non Reactive (08/17 1617)  ZOX:WRUEAVWU (02/18 1616)   Past Medical History:  Diagnosis Date  . Anxiety    does not take medication  . Depression    does not take medication  . Gestational hypertension 10/18/2015  . MRSA (methicillin resistant Staphylococcus aureus)    h/o MRSA = 3-4 years ago = cleared = culture 7/22 negative  . Pregnancy induced hypertension   . Tobacco abuse      Past Surgical History:  Procedure Laterality Date  . ADENOIDECTOMY  1996  . APPENDECTOMY  1993  . WISDOM TOOTH EXTRACTION     one removed at age 67     Medications    Current Discharge Medication List    CONTINUE these medications which have NOT CHANGED   Details  aspirin EC 81 MG tablet Take 1 tablet (81 mg total) by mouth daily. Take after 12 weeks for prevention of preeclampssia later in pregnancy Qty: 300  tablet, Refills: 2    Prenat-FeCbn-FeAsp-Meth-FA-DHA (PRENATE MINI) 18-0.6-0.4-350 MG CAPS Take 1 tablet by mouth daily. Qty: 30 capsule, Refills: 3    sertraline (ZOLOFT) 50 MG tablet Take 1 tablet (50 mg total) by mouth daily. Qty: 30 tablet, Refills: 5    valACYclovir (VALTREX) 500 MG tablet Take 1 tablet (500 mg total) by mouth 2 (two) times daily. Qty: 30 tablet, Refills: 6         Allergies  Sulfa antibiotics  Review of Systems  Constitutional: negative Eyes: negative Ears, nose, mouth, throat, and face: negative Respiratory: negative Cardiovascular: negative Gastrointestinal: negative Genitourinary:negative Integument/breast: negative Hematologic/lymphatic: negative Musculoskeletal:negative Neurological: negative Behavioral/Psych: negative Endocrine: negative Allergic/Immunologic: negative  Physical Exam  LMP 12/16/2016   Lungs:  CTA B Cardio: RRR  Abd: Soft, gravid, NT Presentation: cephalic EXT: No C/C/ 1+ Edema CERVIX: not evaluated   See Prenatal records for more detailed PE.     FHR:  Baseline: 125 bpm, Variability: Good {> 6 bpm), Accelerations: Reactive and Decelerations: Early  Toco: Uterine Contractions: Frequency: Every 3 minutes, Duration: 50-90seconds and Intensity: moderate   Test Results  No results found for this or any previous visit (from the past 24 hour(s)). Group B Strep  negative  Assessment   G2P1001 at 2913w0d Estimated Date of Delivery: 10/19/17  The fetus is reassuring.   Patient Active Problem List   Diagnosis Date Noted  . Labor and delivery, indication for care 10/19/2017  . History of postpartum depression, currently pregnant 04/17/2017  . Maternal varicella, non-immune 03/26/2017  . Rash and nonspecific skin eruption 01/19/2017  . Anxiety and depression 01/19/2017  . History of gestational hypertension 10/18/2015  . Tobacco abuse 01/20/2015    Plan  1. Admit to L&D :   expectant management 2. EFM:--  Category 1 3. Epidural if desired. Stadol for IV pain until epidural requested. 4. Admission labs  5. Anticipate SVD  Doreene Burkennie Jolynn Bajorek, CNM  [facility] 10/19/2017 8:20 AM

## 2017-10-19 NOTE — Progress Notes (Signed)
LABOR NOTE   Carrie Vasquez 31 y.o.GP@ at 4360w0d Active phase labor.  SUBJECTIVE:  Intensity is increasing. She declines IV and epidural for pain.  OBJECTIVE:  BP 118/79   Pulse 77   Temp 98.4 F (36.9 C) (Oral)   Resp 18   Ht 5\' 4"  (1.626 m)   Wt 147 lb (66.7 kg)   LMP 12/16/2016   BMI 25.23 kg/m  No intake/output data recorded.  She has shown cervical change. CERVIX: 8cm:  100%:   -1:   mid position:   soft SVE:   Dilation: 8 Effacement (%): 100 Station: -1 Exam by:: M.Newton,RN CONTRACTIONS: regular, every 2-3 minutes FHR: Fetal heart tracing reviewed. Baseline: 140 bpm, Variability: Good {> 6 bpm), Accelerations: Reactive and Decelerations: variable Category II   Analgesia: Labor support without medications  Labs: Lab Results  Component Value Date   WBC 10.8 10/19/2017   HGB 14.6 10/19/2017   HCT 43.0 10/19/2017   MCV 91.3 10/19/2017   PLT 139 (L) 10/19/2017    ASSESSMENT: 1) Labor curve reviewed.       Progress: Active phase labor.     Membranes:  AROM, meconium light      Active Problems:   Labor and delivery, indication for care   PLAN: expectant management and continue present management  Doreene Burkennie Marieta Markov, CNM  10/19/2017 10:24 AM

## 2017-10-19 NOTE — OB Triage Note (Signed)
Patient presented to L&D with complaints of contractions that woke her up at 4:00 this morning and have consistently worse. Denies leaking of fluid or decreased fetal movements. States she has had some bloody discharge

## 2017-10-20 ENCOUNTER — Encounter: Payer: Medicaid Other | Admitting: Certified Nurse Midwife

## 2017-10-20 LAB — CBC
HEMATOCRIT: 39.2 % (ref 35.0–47.0)
HEMOGLOBIN: 13.2 g/dL (ref 12.0–16.0)
MCH: 31.1 pg (ref 26.0–34.0)
MCHC: 33.7 g/dL (ref 32.0–36.0)
MCV: 92.4 fL (ref 80.0–100.0)
Platelets: 136 10*3/uL — ABNORMAL LOW (ref 150–440)
RBC: 4.24 MIL/uL (ref 3.80–5.20)
RDW: 14.2 % (ref 11.5–14.5)
WBC: 10.8 10*3/uL (ref 3.6–11.0)

## 2017-10-20 LAB — RPR: RPR: NONREACTIVE

## 2017-10-20 MED ORDER — VARICELLA VIRUS VACCINE LIVE 1350 PFU/0.5ML IJ SUSR
0.5000 mL | Freq: Once | INTRAMUSCULAR | Status: AC
  Administered 2017-10-20: 0.5 mL via SUBCUTANEOUS
  Filled 2017-10-20: qty 0.5

## 2017-10-20 MED ORDER — FERROUS SULFATE 325 (65 FE) MG PO TABS: 325.0000 mg | ORAL_TABLET | Freq: Every day | ORAL | 3 refills | Status: DC

## 2017-10-20 MED ORDER — IBUPROFEN 600 MG PO TABS: 600.0000 mg | ORAL_TABLET | Freq: Four times a day (QID) | ORAL | 0 refills | Status: DC

## 2017-10-20 NOTE — Progress Notes (Signed)
Discharge instr reviewed with pt.  Verb u/o of f/u appts.

## 2017-10-20 NOTE — Discharge Summary (Signed)
  Obstetric Discharge Summary  Patient ID: Carrie Vasquez MRN: [id number] DOB/AGE: 01/09/1986 32 y.o.   Date of Admission: 10/19/2017 A. Theotis Burrowhompson, CNM [facility](D. Evans, MD)  Date of Discharge:  10/20/17 M. Jeralyn BennettLawhorn, CNM [facility](D. Evans, MD)  Admitting Diagnosis: Onset of Labor at 164w0d  Secondary Diagnosis: None  Mode of Delivery: normal spontaneous vaginal delivery     Discharge Diagnosis: No other diagnosis   Intrapartum Procedures: Atificial rupture of membranes   Post partum procedures: none  Complications: First degree perineal laceration   Brief Hospital Course   Carrie Friedlandermily Gingras is a [id number] who had a SVD on 10/19/2017;  for further details of this surgery, please refer to the delivey note.  Patient had an uncomplicated postpartum course.  By time of discharge on PPD#1, her pain was controlled on oral pain medications; she had appropriate lochia and was ambulating, voiding without difficulty and tolerating regular diet.  She was deemed stable for discharge to home.    Labs:  CBC Latest Ref Rng & Units 10/20/2017 10/19/2017 07/28/2017  WBC 3.6 - 11.0 K/uL 10.8 10.8 8.2  Hemoglobin 12.0 - 16.0 g/dL 65.713.2 84.614.6 96.211.8  Hematocrit 35.0 - 47.0 % 39.2 43.0 36.5  Platelets 150 - 440 K/uL 136(L) 139(L) 177   O POS  Physical exam:   Temp:  [98 F (36.7 C)-98.7 F (37.1 C)] 98.4 F (36.9 C) (03/14 1154) Pulse Rate:  [59-84] 80 (03/14 1154) Resp:  [18-20] 20 (03/14 1154) BP: (123-137)/(74-89) 123/80 (03/14 1154) SpO2:  [98 %-99 %] 98 % (03/14 1154)  General: alert and no distress  Lochia: appropriate  Abdomen: soft, NT  Uterine Fundus: firm  Extremities: No evidence of DVT seen on physical exam. No lower extremity edema.  Discharge Instructions: Per After Visit Summary.  Activity: Advance as tolerated. Pelvic rest for 6 weeks.  Also refer to After Visit Summary  Diet: Regular  Medications:  Allergies as of 10/20/2017      Reactions   Sulfa Antibiotics Hives      Medication  List    STOP taking these medications   aspirin EC 81 MG tablet   valACYclovir 500 MG tablet Commonly known as:  VALTREX     TAKE these medications   ferrous sulfate 325 (65 FE) MG tablet Take 1 tablet (325 mg total) by mouth daily with breakfast. Start taking on:  10/21/2017   ibuprofen 600 MG tablet Commonly known as:  ADVIL,MOTRIN Take 1 tablet (600 mg total) by mouth every 6 (six) hours.   PRENATE MINI 18-0.6-0.4-350 MG Caps Take 1 tablet by mouth daily.   sertraline 50 MG tablet Commonly known as:  ZOLOFT Take 1 tablet (50 mg total) by mouth daily.      Outpatient follow up:  Follow-up Information    Doreene Burkehompson, Annie, CNM. Call.   Specialties:  Certified Nurse Midwife, Radiology Why:  Please call to schedule six (6) week PPV with A. Janee Mornhompson, CNM Contact information: [address] [place] [address] 620 [phone]          Postpartum contraception: vasectomy  Discharged Condition: stable  Discharged to: home   Newborn Data:  Disposition:home with mother  Apgars: APGAR (1 MIN): 7   APGAR (5 MINS): 8    Baby Feeding: Breast   Gunnar BullaJenkins Michelle Iriel Nason, CNM [facility]

## 2017-10-20 NOTE — Progress Notes (Signed)
Patient ID: Carrie Vasquez, female   DOB: 1986/06/20, 32 y.o.   MRN: [id number]  Post Partum Day # 1, s/p SVD  Subjective:  Patient doing well, sitting in chair nursing newborn.   Denies difficulty breathing or respiratory distress, chest pain, abdominal pain, excessive vaginal bleeding, dysuria, and leg pain or swelling.   Objective:  Temp:  [98 F (36.7 C)-98.7 F (37.1 C)] 98.2 F (36.8 C) (03/14 0806) Pulse Rate:  [59-84] 84 (03/14 0806) Resp:  [18-20] 18 (03/14 0806) BP: (103-138)/(60-90) 137/89 (03/14 0806) SpO2:  [97 %-99 %] 99 % (03/14 0806)  Physical Exam:   General: alert and cooperative   Lungs: clear to auscultation bilaterally  Breasts: normal appearance, no masses or tenderness  Heart: regular rate and rhythm, S1, S2 normal, no murmur, click, rub or gallop  Abdomen: soft, non-tender; bowel sounds normal; no masses,  no organomegaly  Pelvis: Lochia: appropriate, Uterine Fundus: firm  Extremities: DVT Evaluation: No evidence of DVT seen on physical exam.  Recent Labs    10/19/17 0810 10/20/17 0609  HGB 14.6 13.2  HCT 43.0 39.2    Assessment/Plan: Discharge home and Breastfeeding   LOS: 1 day   Gunnar BullaLawhorn, Razi Hickle Michelle, CNM [facility] 10/20/2017 9:03 AM

## 2017-10-20 NOTE — Progress Notes (Signed)
Discharged to home.  To car via auxillary. 

## 2017-10-21 ENCOUNTER — Encounter: Payer: Self-pay | Admitting: Certified Nurse Midwife

## 2017-12-02 ENCOUNTER — Encounter: Payer: Self-pay | Admitting: Certified Nurse Midwife

## 2017-12-02 ENCOUNTER — Ambulatory Visit (INDEPENDENT_AMBULATORY_CARE_PROVIDER_SITE_OTHER): Payer: Medicaid Other | Admitting: Certified Nurse Midwife

## 2017-12-02 MED ORDER — NORETHINDRONE 0.35 MG PO TABS: 1.0000 | ORAL_TABLET | Freq: Every day | ORAL | 11 refills | Status: DC

## 2017-12-02 NOTE — Patient Instructions (Signed)

## 2017-12-02 NOTE — Progress Notes (Signed)
Pt is here for a post partum visit. Is breast feeding. Has not had a period. Has resumed intercourse with no problems.Screening 13.

## 2017-12-02 NOTE — Progress Notes (Signed)
Subjective:    Carrie Vasquez is a 32 y.o. [id number] Caucasian female who presents for a postpartum visit. She is 6 weeks postpartum following a spontaneous vaginal delivery at 40 gestational weeks. Anesthesia: none. I have fully reviewed the prenatal and intrapartum course. Postpartum course has been WNL. Baby's course has been WNL. Baby is feeding by breast. Bleeding no bleeding. Bowel function is normal. Bladder function is normal. Patient is sexually active. Contraception method is condoms. Would like to start mini pill. Postpartum depression screening: positive. Score 13.  Last pap 2016 and was normal.  The following portions of the patient's history were reviewed and updated as appropriate: allergies, current medications, past medical history, past surgical history and problem list.  Review of Systems Pertinent items are noted in HPI.   Vitals:   12/02/17 1349  BP: 109/74  Pulse: (!) 54  Weight: 129 lb 8 oz (58.7 kg)  Height: 5\' 4"  (1.626 m)   Patient's last menstrual period was 12/16/2016.  Objective:   General:  alert, cooperative and no distress   Breasts:  deferred, no complaints  Lungs: clear to auscultation bilaterally  Heart:  regular rate and rhythm  Abdomen: soft, nontender   Vulva: normal  Vagina: normal vagina  Cervix:  closed  Corpus: Well-involuted  Adnexa:  Non-palpable  Rectal Exam: no hemorrhoids        Assessment:   Postpartum exam 6 wks s/p SVD Breastfeeding Depression screening Contraception counseling   Plan:  : condoms, to start mini pill . She states that she does not want to change her zolft. She is open to counseling. Referral given to Netta CorriganMarilyn Steel. Postpartum internation hotline number given. She states that she is also have martial problems and that her 31 yr old is acting out. She denies any desire to hurt herself or her baby. She is receptive to counseling.  The incredible years information given .  Follow up in: 6 months for annual/pap smear or  earlier if needed  Doreene BurkeAnnie Pao Haffey, CNM

## 2017-12-14 ENCOUNTER — Telehealth: Payer: Self-pay

## 2017-12-14 NOTE — Telephone Encounter (Signed)
Left message for pt- fax received from [facility] pharmacy- norethindrone 0.35mg  is on backorder. Pt was given the option to change the pill or have it faxed to a different pharmacy. Asked to please let us know.

## 2018-04-20 IMAGING — US US OB TRANSVAGINAL
1 series · 14 of 28 positions shown · non-contrast
Comparison: None.

CLINICAL DATA: 30-year-old female with positive HCG levels
presenting with sudden onset pelvic cramping and vaginal spotting.

EXAM:
OBSTETRIC <14 WK US AND TRANSVAGINAL OB US
TECHNIQUE: Both transabdominal and transvaginal ultrasound examinations were
performed for complete evaluation of the gestation as well as the
maternal uterus, adnexal regions, and pelvic cul-de-sac.
Transvaginal technique was performed to assess early pregnancy.

[Series 1: us ob transvaginal · 0.11mm/px · 14 of 108 slices shown]
[im 4/108]
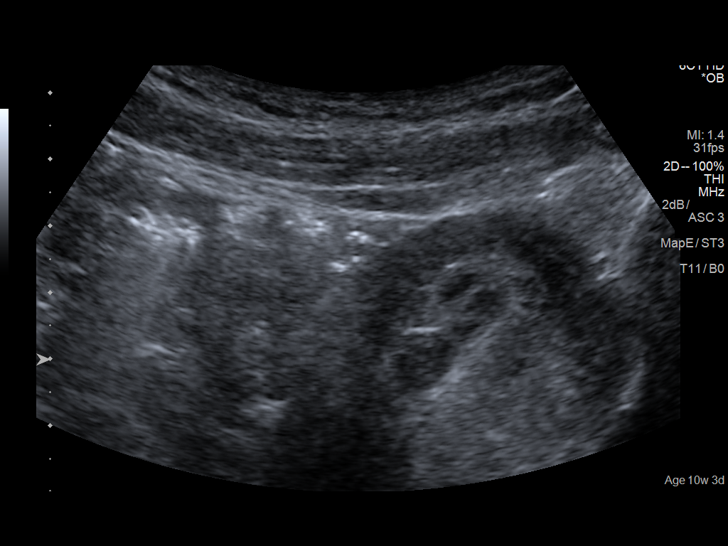
[im 12/108]
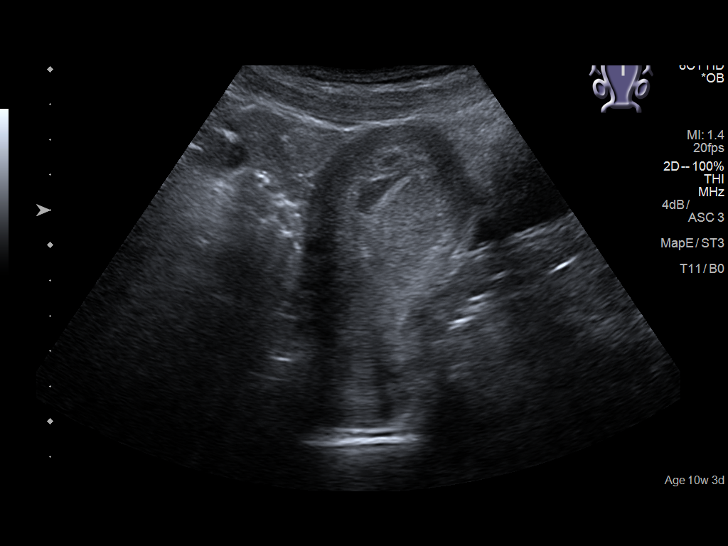
[im 20/108]
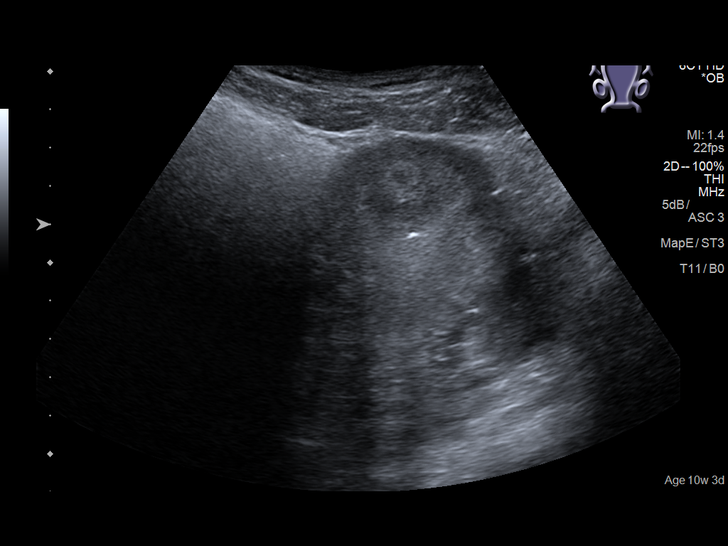
[im 28/108]
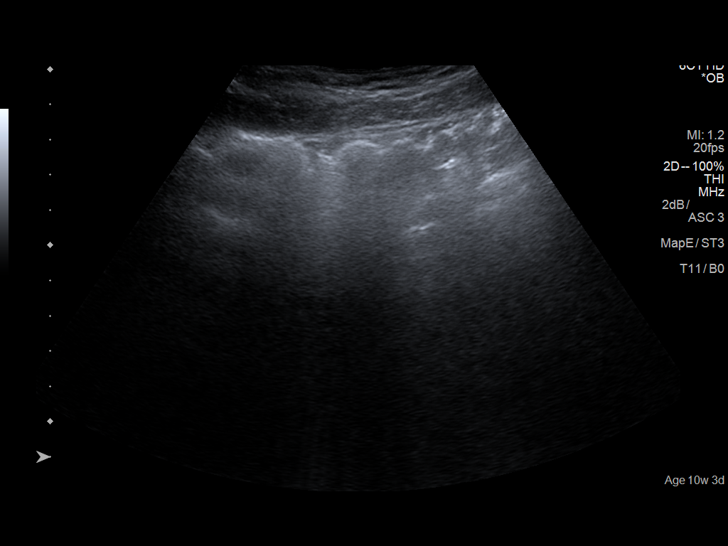
[im 36/108]
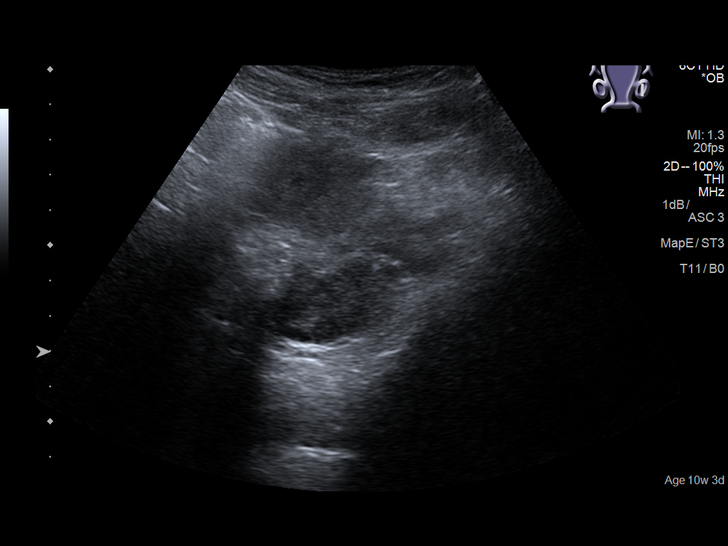
[im 44/108]
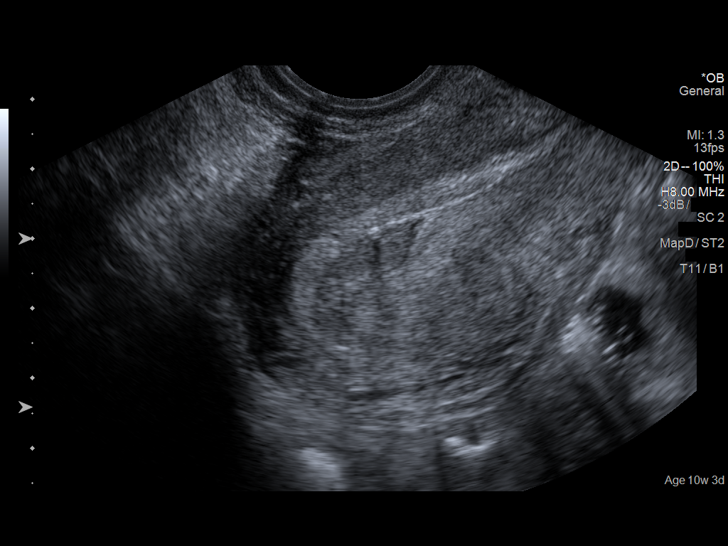
[im 52/108]
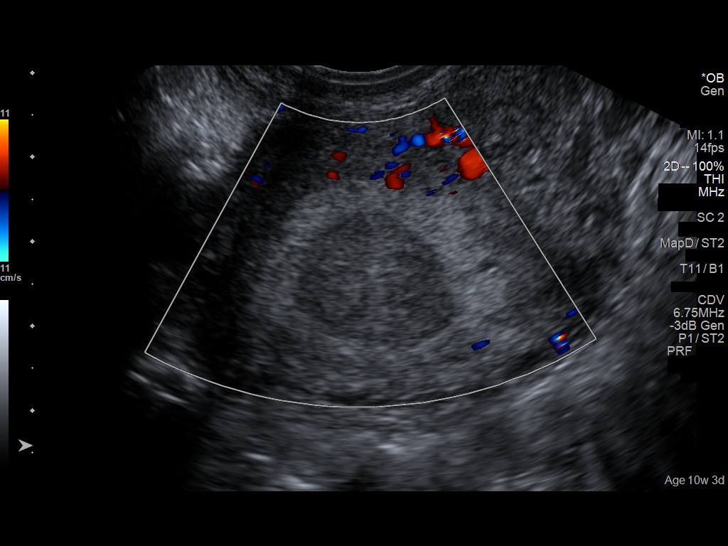
[im 60/108]
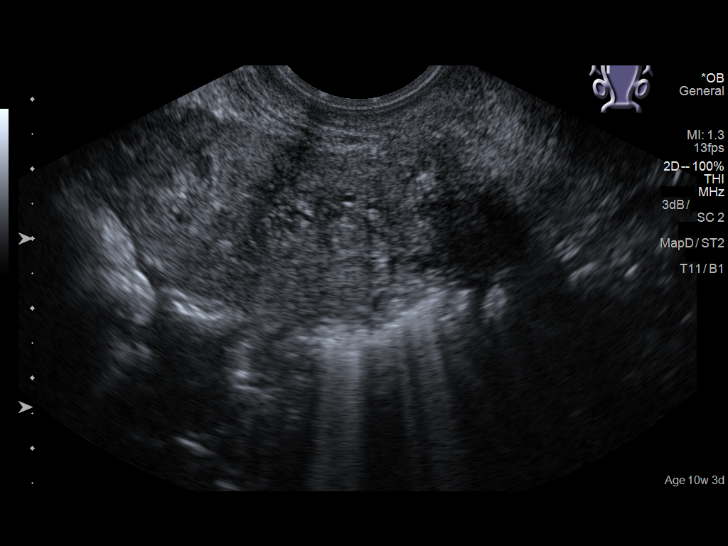
[im 68/108]
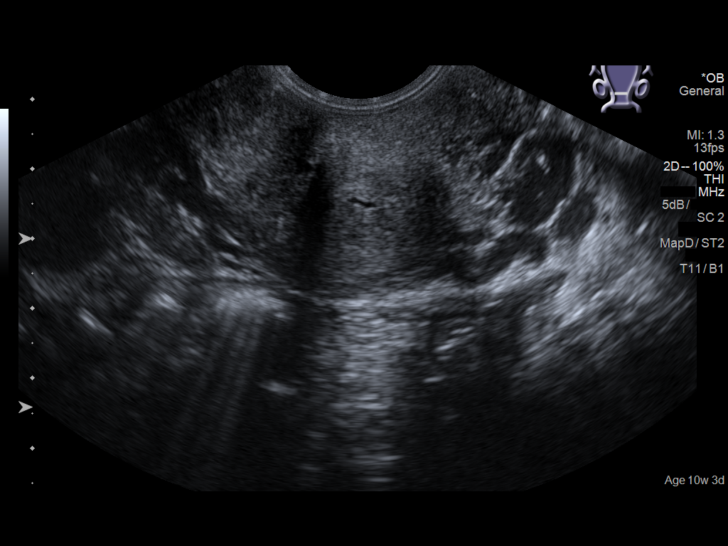
[im 76/108]
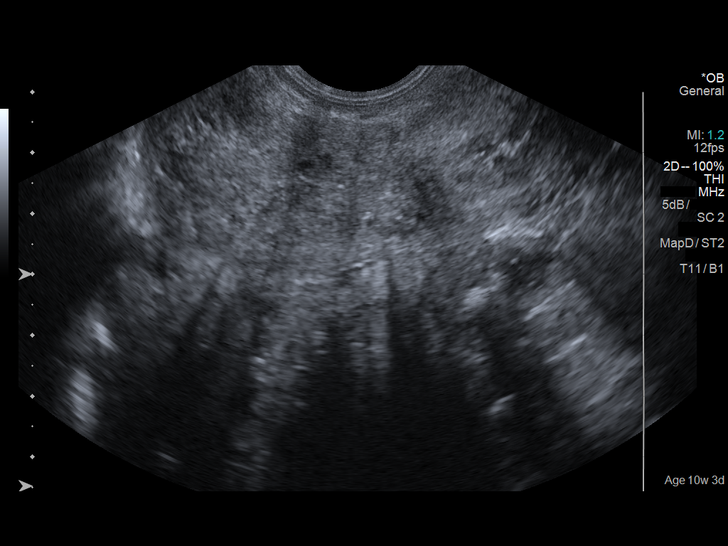
[im 84/108]
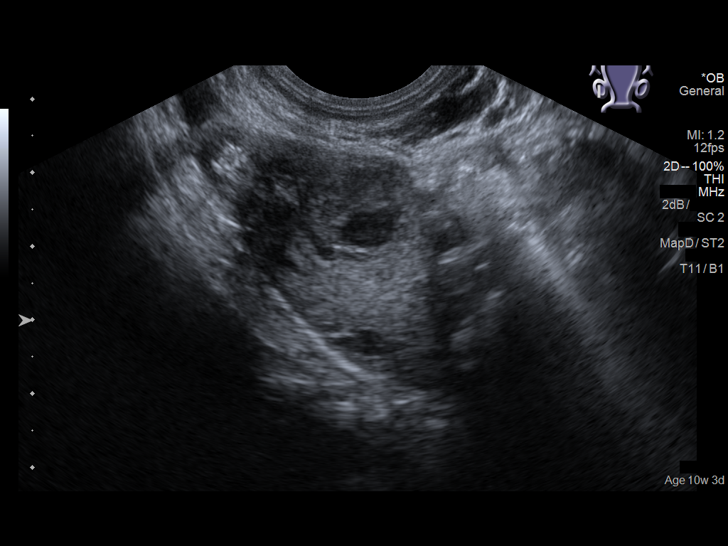
[im 92/108]
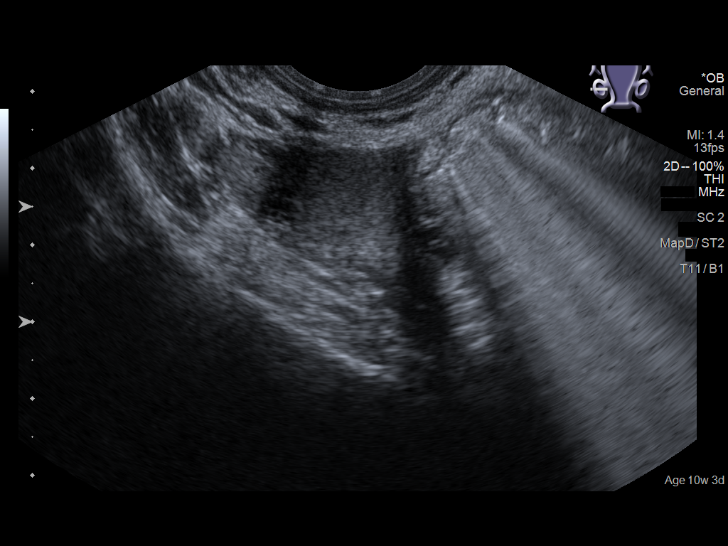
[im 100/108]
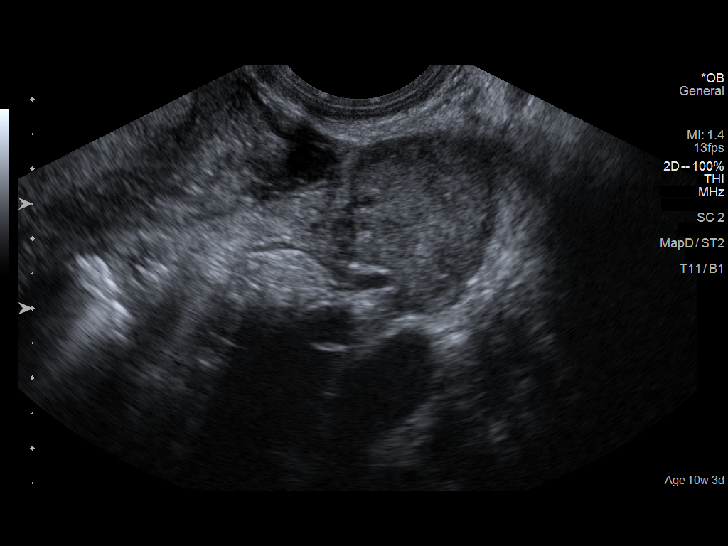
[im 108/108]
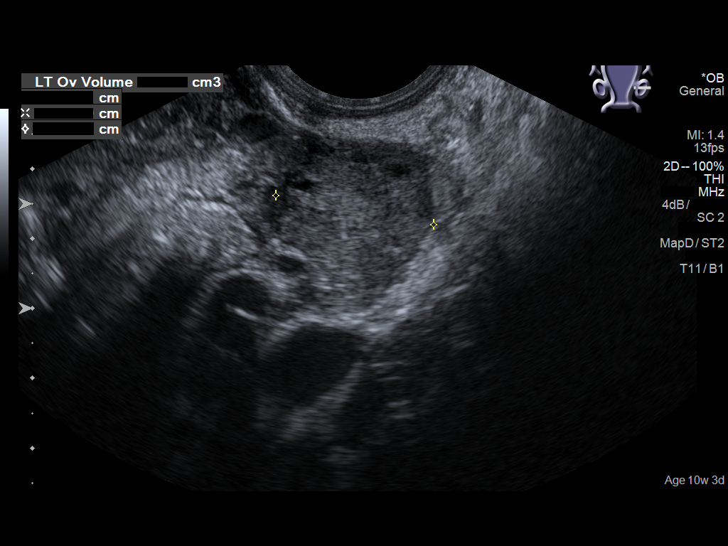

[14 of 28 positions shown; findings below may reference images not displayed]

FINDINGS: Intrauterine gestational sac: Stable

Yolk sac:  Seen

Embryo:  Not present

MSD: 6  mm   5 w   2  d

Subchorionic hemorrhage: There is a moderate subchorionic hemorrhage
anterior to the gestational sac measuring 1.9 x 0.8 x 1.9 cm
abutting approximately 40% circumference of the gestational sac.

Maternal uterus/adnexae: The maternal ovaries appear unremarkable.
Probable right ovarian corpus luteum.
IMPRESSION: Single intrauterine gestational sac with an estimated gestational
age of 5 weeks, 2 days. No fetal pole identified at this time.
Follow-up in 7-11 days, or earlier if clinically indicated,
recommended.

Moderate size subchorionic hemorrhage.

## 2019-01-16 ENCOUNTER — Other Ambulatory Visit (HOSPITAL_COMMUNITY)
Admission: RE | Admit: 2019-01-16 | Discharge: 2019-01-16 | Disposition: A | Discharge status: Home / Self Care | Payer: Medicaid Other | Source: Ambulatory Visit | Attending: Certified Nurse Midwife | Admitting: Certified Nurse Midwife

## 2019-01-16 ENCOUNTER — Ambulatory Visit (INDEPENDENT_AMBULATORY_CARE_PROVIDER_SITE_OTHER): Payer: Medicaid Other | Admitting: Certified Nurse Midwife

## 2019-01-16 ENCOUNTER — Other Ambulatory Visit: Payer: Self-pay

## 2019-01-16 ENCOUNTER — Encounter: Payer: Self-pay | Admitting: Certified Nurse Midwife

## 2019-01-16 VITALS — BP 113/79 | HR 65 | Ht 64.0 in | Wt 127.4 lb

## 2019-01-16 DIAGNOSIS — Z30011 Encounter for initial prescription of contraceptive pills: Secondary | ICD-10-CM | POA: Diagnosis not present

## 2019-01-16 DIAGNOSIS — Z124 Encounter for screening for malignant neoplasm of cervix: Secondary | ICD-10-CM

## 2019-01-16 DIAGNOSIS — F329 Major depressive disorder, single episode, unspecified: Secondary | ICD-10-CM | POA: Diagnosis not present

## 2019-01-16 DIAGNOSIS — Z0000 Encounter for general adult medical examination without abnormal findings: Secondary | ICD-10-CM | POA: Diagnosis not present

## 2019-01-16 DIAGNOSIS — F32A Depression, unspecified: Secondary | ICD-10-CM

## 2019-01-16 LAB — POCT URINE PREGNANCY: Preg Test, Ur: NEGATIVE

## 2019-01-16 MED ORDER — SERTRALINE HCL 50 MG PO TABS: 50.0000 mg | ORAL_TABLET | Freq: Every day | ORAL | 1 refills | Status: DC

## 2019-01-16 MED ORDER — NORGESTIMATE-ETH ESTRADIOL 0.25-35 MG-MCG PO TABS: 1.0000 | ORAL_TABLET | Freq: Every day | ORAL | 11 refills | Status: DC

## 2019-01-16 NOTE — Patient Instructions (Signed)
Preventive Care 18-39 Years, Female Preventive care refers to lifestyle choices and visits with your health care provider that can promote health and wellness. What does preventive care include?   A yearly physical exam. This is also called an annual well check.  Dental exams once or twice a year.  Routine eye exams. Ask your health care provider how often you should have your eyes checked.  Personal lifestyle choices, including: ? Daily care of your teeth and gums. ? Regular physical activity. ? Eating a healthy diet. ? Avoiding tobacco and drug use. ? Limiting alcohol use. ? Practicing safe sex. ? Taking vitamin and mineral supplements as recommended by your health care provider. What happens during an annual well check? The services and screenings done by your health care provider during your annual well check will depend on your age, overall health, lifestyle risk factors, and family history of disease. Counseling Your health care provider may ask you questions about your:  Alcohol use.  Tobacco use.  Drug use.  Emotional well-being.  Home and relationship well-being.  Sexual activity.  Eating habits.  Work and work environment.  Method of birth control.  Menstrual cycle.  Pregnancy history. Screening You may have the following tests or measurements:  Height, weight, and BMI.  Diabetes screening. This is done by checking your blood sugar (glucose) after you have not eaten for a while (fasting).  Blood pressure.  Lipid and cholesterol levels. These may be checked every 5 years starting at age 20.  Skin check.  Hepatitis C blood test.  Hepatitis B blood test.  Sexually transmitted disease (STD) testing.  BRCA-related cancer screening. This may be done if you have a family history of breast, ovarian, tubal, or peritoneal cancers.  Pelvic exam and Pap test. This may be done every 3 years starting at age 21. Starting at age 30, this may be done every 5  years if you have a Pap test in combination with an HPV test. Discuss your test results, treatment options, and if necessary, the need for more tests with your health care provider. Vaccines Your health care provider may recommend certain vaccines, such as:  Influenza vaccine. This is recommended every year.  Tetanus, diphtheria, and acellular pertussis (Tdap, Td) vaccine. You may need a Td booster every 10 years.  Varicella vaccine. You may need this if you have not been vaccinated.  HPV vaccine. If you are 26 or younger, you may need three doses over 6 months.  Measles, mumps, and rubella (MMR) vaccine. You may need at least one dose of MMR. You may also need a second dose.  Pneumococcal 13-valent conjugate (PCV13) vaccine. You may need this if you have certain conditions and were not previously vaccinated.  Pneumococcal polysaccharide (PPSV23) vaccine. You may need one or two doses if you smoke cigarettes or if you have certain conditions.  Meningococcal vaccine. One dose is recommended if you are age 19-21 years and a first-year college student living in a residence hall, or if you have one of several medical conditions. You may also need additional booster doses.  Hepatitis A vaccine. You may need this if you have certain conditions or if you travel or work in places where you may be exposed to hepatitis A.  Hepatitis B vaccine. You may need this if you have certain conditions or if you travel or work in places where you may be exposed to hepatitis B.  Haemophilus influenzae type b (Hib) vaccine. You may need this if you   have certain risk factors. Talk to your health care provider about which screenings and vaccines you need and how often you need them. This information is not intended to replace advice given to you by your health care provider. Make sure you discuss any questions you have with your health care provider. Document Released: 09/21/2001 Document Revised: 03/08/2017  Document Reviewed: 05/27/2015 Elsevier Interactive Patient Education  2019 [employer].

## 2019-01-16 NOTE — Progress Notes (Signed)
GYNECOLOGY ANNUAL PREVENTATIVE CARE ENCOUNTER NOTE  History:     Carrie Vasquez is a 33 y.o. 132P2002 female here for a routine annual gynecologic exam.  Current complaints: depression, would like to start back on Zoloft. States she is having marital difficulties and would like to go to counseling.   Denies abnormal vaginal bleeding, discharge, pelvic pain, problems with intercourse or other gynecologic concerns.    Gynecologic History Patient's last menstrual period was 12/19/2018 (exact date). Contraception: calander method, would like to start OCP Last Pap: 2016. Results were: normal Last mammogram: N/A   Obstetric History OB History  Gravida Para Term Preterm AB Living  2 2 2  0 0 2  SAB TAB Ectopic Multiple Live Births  0 0 0 0 2    # Outcome Date GA Lbr Len/2nd Weight Sex Delivery Anes PTL Lv  2 Term 10/19/17 851w0d / 00:09 8 lb 13.1 oz (4 kg) F Vag-Spont None  LIV  1 Term 10/20/15 6551w0d 05:00 / 02:38 8 lb 5 oz (3.77 kg) F Vag-Spont EPI  LIV    Past Medical History:  Diagnosis Date  . Anxiety    does not take medication  . Depression    does not take medication  . Gestational hypertension 10/18/2015  . MRSA (methicillin resistant Staphylococcus aureus)    h/o MRSA = 3-4 years ago = cleared = culture 7/22 negative  . Pregnancy induced hypertension   . Tobacco abuse     Past Surgical History:  Procedure Laterality Date  . ADENOIDECTOMY  1996  . APPENDECTOMY  1993  . WISDOM TOOTH EXTRACTION     one removed at age 89    Current Outpatient Medications on File Prior to Visit  Medication Sig Dispense Refill  . Prenat-FeCbn-FeAsp-Meth-FA-DHA (PRENATE MINI) 18-0.6-0.4-350 MG CAPS Take 1 tablet by mouth daily. 30 capsule 3  . norethindrone (MICRONOR,CAMILA,ERRIN) 0.35 MG tablet Take 1 tablet (0.35 mg total) by mouth daily. (Patient not taking: Reported on 01/16/2019) 1 Package 11  . sertraline (ZOLOFT) 50 MG tablet Take 1 tablet (50 mg total) by mouth daily. (Patient not  taking: Reported on 01/16/2019) 30 tablet 5   No current facility-administered medications on file prior to visit.     Allergies  Allergen Reactions  . Sulfa Antibiotics Hives    Social History:  reports that she quit smoking about 3 years ago. She has never used smokeless tobacco. She reports that she does not drink alcohol or use drugs.  Family History  Problem Relation Age of Onset  . Hypertension Paternal Grandfather   . Diabetes Maternal Grandfather   . Cancer Maternal Grandfather        leukemia  . Epilepsy Brother     The following portions of the patient's history were reviewed and updated as appropriate: allergies, current medications, past family history, past medical history, past social history, past surgical history and problem list.  Review of Systems Pertinent items noted in HPI and remainder of comprehensive ROS otherwise negative.  Physical Exam:  BP 113/79   Pulse 65   Ht 5\' 4"  (1.626 m)   Wt 127 lb 7 oz (57.8 kg)   LMP 12/19/2018 (Exact Date)   Breastfeeding Yes   BMI 21.87 kg/m  CONSTITUTIONAL: Well-developed, well-nourished female in no acute distress.  HENT:  Normocephalic, atraumatic, External right and left ear normal. Oropharynx is clear and moist EYES: Conjunctivae and EOM are normal. Pupils are equal, round, and reactive to light. No scleral icterus.  NECK: Normal range of  motion, supple, no masses.  Normal thyroid.  SKIN: Skin is warm and dry. No rash noted. Not diaphoretic. No erythema. No pallor. MUSCULOSKELETAL: Normal range of motion. No tenderness.  No cyanosis, clubbing, or edema.  2+ distal pulses. NEUROLOGIC: Alert and oriented to person, place, and time. Normal reflexes, muscle tone coordination. No cranial nerve deficit noted. PSYCHIATRIC: Normal mood and affect. Normal behavior. Normal judgment and thought content. CARDIOVASCULAR: Normal heart rate noted, regular rhythm RESPIRATORY: Clear to auscultation bilaterally. Effort and breath  sounds normal, no problems with respiration noted. BREASTS: Symmetric in size. No masses, skin changes, nipple drainage, or lymphadenopathy. ABDOMEN: Soft, normal bowel sounds, no distention noted.  No tenderness, rebound or guarding.  PELVIC: Normal appearing external genitalia; normal appearing vaginal mucosa and cervix.  No abnormal discharge noted.  Pap smear obtained. Small contact bleeding.  Normal uterine size, no other palpable masses, no uterine or adnexal tenderness.    Office Visit from 01/16/2019 in [facility]  PHQ-9 Total Score  24        Assessment and Plan:    Annual well women GYN exam  Will follow up results of pap smear and manage accordingly. Mammogram not indicated Referral to psychiatry Labs today TSH, Pap. Declines lipid profile Meds ordered Zoloft and sprintec OCP- pt denies any contraindications to use.  Routine preventative health maintenance measures emphasized. Please refer to After Visit Summary for other counseling recommendations.     Follow up medication check 4-6 wks.   Philip Aspen, CNM

## 2019-01-16 NOTE — Addendum Note (Signed)
Addended by: Raliegh Ip on: 01/16/2019 02:50 PM   Modules accepted: Orders

## 2019-01-17 LAB — THYROID PANEL WITH TSH
Free Thyroxine Index: 1.9 (ref 1.2–4.9)
T3 Uptake Ratio: 26 % (ref 24–39)
T4, Total: 7.4 ug/dL (ref 4.5–12.0)
TSH: 1.39 u[IU]/mL (ref 0.450–4.500)

## 2019-01-19 LAB — CYTOLOGY - PAP
Diagnosis: NEGATIVE
HPV: NOT DETECTED

## 2019-02-27 ENCOUNTER — Encounter: Payer: Medicaid Other | Admitting: Certified Nurse Midwife

## 2019-03-12 ENCOUNTER — Encounter: Payer: Self-pay | Admitting: Certified Nurse Midwife

## 2019-07-04 ENCOUNTER — Ambulatory Visit (INDEPENDENT_AMBULATORY_CARE_PROVIDER_SITE_OTHER)
Admission: RE | Admit: 2019-07-04 | Discharge: 2019-07-04 | Disposition: A | Discharge status: Home / Self Care | Payer: Medicaid Other | Source: Ambulatory Visit

## 2019-07-04 DIAGNOSIS — H44001 Unspecified purulent endophthalmitis, right eye: Secondary | ICD-10-CM | POA: Diagnosis not present

## 2019-07-04 DIAGNOSIS — S0501XA Injury of conjunctiva and corneal abrasion without foreign body, right eye, initial encounter: Secondary | ICD-10-CM

## 2019-07-04 MED ORDER — OFLOXACIN 0.3 % OP SOLN: OPHTHALMIC | 0 refills | Status: DC

## 2019-07-04 NOTE — ED Provider Notes (Signed)
[name]  Virtual Visit via Video Note:  [name]  initiated request for Telemedicine visit with [facility] team. I connected with [name]  on 07/04/2019 at 10:35 AM  for a synchronized telemedicine visit using a video enabled HIPPA compliant telemedicine application. I verified that I am speaking with [name]  using two identifiers. Lestine Box, PA-C  was physically located in a [facility]gent care site and Avonne Berkery was located at a different location.   The limitations of evaluation and management by telemedicine as well as the availability of in-person appointments were discussed. Patient was informed that she  may incur a bill ( including co-pay) for this virtual visit encounter. Carrie Vasquez  expressed understanding and gave verbal consent to proceed with virtual visit.   [id number] 07/04/19 Arrival Time: 1023  CC: "Scratched cornea"  SUBJECTIVE:  Carrie Vasquez is a 33 y.o. female who presents with complaint of RT eye pain and swelling x 1 day.  One year old daughter scratched her eye yesterday while "trying to pull it out of socket."  Has tried OTC eye drops, flushing, and warm compress with minimal relief.  Denies aggravating factors.  Denies similar symptoms in the past.  Complains of blurred vision, and FB sensation.  Denies fever, chills, nausea, vomiting, painful eye movements, halos, discharge, itching, double vision, periorbital erythema.     Denies contact lens use.    ROS: As per HPI.  All other pertinent ROS negative.     Past Medical History:  Diagnosis Date  . Anxiety    does not take medication  . Depression    does not take medication  . Gestational hypertension 10/18/2015  . MRSA (methicillin resistant Staphylococcus aureus)    h/o MRSA = 3-4 years ago = cleared = culture 7/22 negative  . Pregnancy induced hypertension   . Tobacco abuse    Past Surgical History:  Procedure  Laterality Date  . ADENOIDECTOMY  1996  . APPENDECTOMY  1993  . WISDOM TOOTH EXTRACTION     one removed at age 33   Allergies  Allergen Reactions  . Sulfa Antibiotics Hives   No current facility-administered medications on file prior to encounter.    Current Outpatient Medications on File Prior to Encounter  Medication Sig Dispense Refill  . [DISCONTINUED] norethindrone (MICRONOR,CAMILA,ERRIN) 0.35 MG tablet Take 1 tablet (0.35 mg total) by mouth daily. (Patient not taking: Reported on 01/16/2019) 1 Package 11  . [DISCONTINUED] norgestimate-ethinyl estradiol (ORTHO-CYCLEN) 0.25-35 MG-MCG tablet Take 1 tablet by mouth daily. 1 Package 11  . [DISCONTINUED] sertraline (ZOLOFT) 50 MG tablet Take 1 tablet (50 mg total) by mouth daily for 30 days. 30 tablet 1    OBJECTIVE:  There were no vitals filed for this visit.  General appearance: alert; no distress Eyes: No obvious conjunctival erythema, RT periorbital swelling. EOMI without discomfort;  no obvious drainage (exam limited due to video quality) Throat: tolerating own secretions; no slurred speech Neck: supple Lungs: normal respiratory effort Neuro: No obvious facial droop/ asymmetry Psychological: alert and cooperative; normal mood and affect   ASSESSMENT & PLAN:  1. Eye infection, right   2. Abrasion of right cornea, initial encounter     Meds ordered this encounter  Medications  . ofloxacin (OCUFLOX) 0.3 % ophthalmic solution    Sig: instill 1 or 2 drops in affected eye(s) every 2 to 4 hours for 2 days, then 1 to 2 drops 4 times  daily on days 3 through 7    Dispense:  10 mL    Refill:  0    Order Specific Question:   Supervising Provider    Answer:   Eustace Moore [[id number]]    Symptoms consistent with corneal abrasion Use ofloxacin eye drops as prescribed and to completion Use OTC systane or genteal gel eye drops at night as needed for symptomatic relief Use OTC ibuprofen or tylenol as needed for pain relief  Follow up in person, follow up with ophthalmology, or go to the ED if symptoms persists or worsen such as fever, chills, redness, swelling, eye pain, painful eye movements, vision changes, etc...  I discussed the assessment and treatment plan with the patient. The patient was provided an opportunity to ask questions and all were answered. The patient agreed with the plan and demonstrated an understanding of the instructions.   The patient was advised to call back or seek an in-person evaluation if the symptoms worsen or if the condition fails to improve as anticipated.  I provided 10 minutes of non-face-to-face time during this encounter.  Rennis Harding, PA-C  07/04/2019 10:32 AM    Rennis Harding, PA-C 07/04/19 1040

## 2019-07-04 NOTE — Discharge Instructions (Signed)
Symptoms consistent with corneal abrasion Use ofloxacin eye drops as prescribed and to completion Use OTC systane or genteal gel eye drops at night as needed for symptomatic relief Use OTC ibuprofen or tylenol as needed for pain relief Follow up in person, follow up with ophthalmology, or go to the ED if symptoms persists or worsen such as fever, chills, redness, swelling, eye pain, painful eye movements, vision changes, etc..Marland Kitchen

## 2019-07-12 DIAGNOSIS — S0500XD Injury of conjunctiva and corneal abrasion without foreign body, unspecified eye, subsequent encounter: Secondary | ICD-10-CM

## 2019-07-19 NOTE — Telephone Encounter (Signed)
Pt's Medicaid [place] Access card states [facility] as the PCP.  This is the only office that can refer pt for care.  Pt would need to make an appt w/them in order to be referred out and Medicaid [place] pay for any visit.  Please advise.

## 2019-07-20 NOTE — Telephone Encounter (Signed)
Will you please notify patient? Thanks.

## 2019-11-15 DIAGNOSIS — Z23 Encounter for immunization: Secondary | ICD-10-CM | POA: Diagnosis not present

## 2019-12-06 DIAGNOSIS — Z23 Encounter for immunization: Secondary | ICD-10-CM | POA: Diagnosis not present

## 2020-01-28 DIAGNOSIS — L259 Unspecified contact dermatitis, unspecified cause: Secondary | ICD-10-CM | POA: Diagnosis not present

## 2020-10-17 ENCOUNTER — Other Ambulatory Visit: Payer: Self-pay | Admitting: Otolaryngology

## 2020-10-17 DIAGNOSIS — J301 Allergic rhinitis due to pollen: Secondary | ICD-10-CM | POA: Diagnosis not present

## 2020-10-17 DIAGNOSIS — J3489 Other specified disorders of nose and nasal sinuses: Secondary | ICD-10-CM | POA: Diagnosis not present

## 2020-10-17 DIAGNOSIS — R221 Localized swelling, mass and lump, neck: Secondary | ICD-10-CM | POA: Diagnosis not present

## 2020-10-17 DIAGNOSIS — R0683 Snoring: Secondary | ICD-10-CM | POA: Diagnosis not present

## 2020-10-17 DIAGNOSIS — J392 Other diseases of pharynx: Secondary | ICD-10-CM

## 2020-11-04 ENCOUNTER — Ambulatory Visit: Payer: Medicaid Other

## 2020-11-07 DIAGNOSIS — J0100 Acute maxillary sinusitis, unspecified: Secondary | ICD-10-CM | POA: Diagnosis not present

## 2020-12-18 ENCOUNTER — Other Ambulatory Visit
Admission: RE | Admit: 2020-12-18 | Discharge: 2020-12-18 | Disposition: A | Discharge status: Home / Self Care | Payer: Medicaid Other | Source: Ambulatory Visit | Attending: Otolaryngology | Admitting: Otolaryngology

## 2020-12-18 ENCOUNTER — Ambulatory Visit
Admission: RE | Admit: 2020-12-18 | Discharge: 2020-12-18 | Disposition: A | Discharge status: Home / Self Care | Payer: Medicaid Other | Source: Ambulatory Visit | Attending: Otolaryngology | Admitting: Otolaryngology

## 2020-12-18 ENCOUNTER — Other Ambulatory Visit: Payer: Self-pay

## 2020-12-18 DIAGNOSIS — J392 Other diseases of pharynx: Secondary | ICD-10-CM | POA: Insufficient documentation

## 2020-12-18 DIAGNOSIS — J320 Chronic maxillary sinusitis: Secondary | ICD-10-CM | POA: Diagnosis not present

## 2020-12-18 DIAGNOSIS — R590 Localized enlarged lymph nodes: Secondary | ICD-10-CM | POA: Diagnosis not present

## 2020-12-18 LAB — HCG, QUANTITATIVE, PREGNANCY: hCG, Beta Chain, Quant, S: <1 m[IU]/mL (ref <5)

## 2020-12-18 MED ORDER — IOHEXOL 300 MG/ML  SOLN
75.0000 mL | Freq: Once | INTRAMUSCULAR | Status: AC | PRN
  Administered 2020-12-18: 75 mL via INTRAVENOUS

## 2020-12-29 DIAGNOSIS — J342 Deviated nasal septum: Secondary | ICD-10-CM | POA: Diagnosis not present

## 2020-12-29 DIAGNOSIS — J3489 Other specified disorders of nose and nasal sinuses: Secondary | ICD-10-CM | POA: Diagnosis not present

## 2021-01-28 ENCOUNTER — Encounter: Payer: Self-pay | Admitting: Otolaryngology

## 2021-02-05 NOTE — Discharge Instructions (Signed)
[facility] ENDOSCOPIC SINUS SURGERY [facility]  What is Functional Endoscopic Sinus Surgery?  The Surgery involves making the natural openings of the sinuses larger by removing the bony partitions that separate the sinuses from the nasal cavity.  The natural sinus lining is preserved as much as possible to allow the sinuses to resume normal function after the surgery.  In some patients nasal polyps (excessively swollen lining of the sinuses) may be removed to relieve obstruction of the sinus openings.  The surgery is performed through the nose using lighted scopes, which eliminates the need for incisions on the face.  A septoplasty is a different procedure which is sometimes performed with sinus surgery.  It involves straightening the boy partition that separates the two sides of your nose.  A crooked or deviated septum may need repair if is obstructing the sinuses or nasal airflow.  Turbinate reduction is also often performed during sinus surgery.  The turbinates are bony proturberances from the side walls of the nose which swell and can obstruct the nose in patients with sinus and allergy problems.  Their size can be surgically reduced to help relieve nasal obstruction.  What Can Sinus Surgery Do For Me?  Sinus surgery can reduce the frequency of sinus infections requiring antibiotic treatment.  This can provide improvement in nasal congestion, post-nasal drainage, facial pressure and nasal obstruction.  Surgery will NOT prevent you from ever having an infection again, so it usually only for patients who get infections 4 or more times yearly requiring antibiotics, or for infections that do not clear with antibiotics.  It will not cure nasal allergies, so patients with allergies may still require medication to treat their allergies after surgery. Surgery may improve headaches related to sinusitis, however, some people will continue to  require medication to control sinus headaches related to allergies.  Surgery will do nothing for other forms of headache (migraine, tension or cluster).  What Are the Risks of Endoscopic Sinus Surgery?  Current techniques allow surgery to be performed safely with little risk, however, there are rare complications that patients should be aware of.  Because the sinuses are located around the eyes, there is risk of eye injury, including blindness, though again, this would be quite rare. This is usually a result of bleeding behind the eye during surgery, which puts the vision oat risk, though there are treatments to protect the vision and prevent permanent disrupted by surgery causing a leak of the spinal fluid that surrounds the brain.  More serious complications would include bleeding inside the brain cavity or damage to the brain.  Again, all of these complications are uncommon, and spinal fluid leaks can be safely managed surgically if they occur.  The most common complication of sinus surgery is bleeding from the nose, which may require packing or cauterization of the nose.  Continued sinus have polyps may experience recurrence of the polyps requiring revision surgery.  Alterations of sense of smell or injury to the tear ducts are also rare complications.   What is the Surgery Like, and what is the Recovery?  The Surgery usually takes a couple of hours to perform, and is usually performed under a general anesthetic (completely asleep).  Patients are usually discharged home after a couple of hours.  Sometimes during surgery it is necessary to pack the nose to control bleeding, and the packing is left in place for 24 - 48 hours, and removed by your surgeon.    If a septoplasty was performed during the procedure, there is often a splint placed which must be removed after 5-7 days.   Discomfort: Pain is usually mild to moderate, and can be controlled by prescription pain medication or acetaminophen (Tylenol).   Aspirin, Ibuprofen (Advil, Motrin), or Naprosyn (Aleve) should be avoided, as they can cause increased bleeding.  Most patients feel sinus pressure like they have a bad head cold for several days.  Sleeping with your head elevated can help reduce swelling and facial pressure, as can ice packs over the face.  A humidifier may be helpful to keep the mucous and blood from drying in the nose.   Diet: There are no specific diet restrictions, however, you should generally start with clear liquids and a light diet of bland foods because the anesthetic can cause some nausea.  Advance your diet depending on how your stomach feels.  Taking your pain medication with food will often help reduce stomach upset which pain medications can cause.  Nasal Saline Irrigation: It is important to remove blood clots and dried mucous from the nose as it is healing.  This is done by having you irrigate the nose at least 3 - 4 times daily with a salt water solution.  We recommend using NeilMed Sinus Rinse (available at the drug store).  Fill the squeeze bottle with the solution, bend over a sink, and insert the tip of the squeeze bottle into the nose  of an inch.  Point the tip of the squeeze bottle towards the inside corner of the eye on the same side your irrigating.  Squeeze the bottle and gently irrigate the nose.  If you bend forward as you do this, most of the fluid will flow back out of the nose, instead of down your throat.   The solution should be warm, near body temperature, when you irrigate.   Each time you irrigate, you should use a full squeeze bottle.   Note that if you are instructed to use Nasal Steroid Sprays at any time after your surgery, irrigate with saline BEFORE using the steroid spray, so you do not wash it all out of the nose. Another product, Nasal Saline Gel (such as AYR Nasal Saline Gel) can be applied in each nostril 3 - 4 times daily to moisture the nose and reduce scabbing or crusting.  Bleeding:   Bloody drainage from the nose can be expected for several days, and patients are instructed to irrigate their nose frequently with salt water to help remove mucous and blood clots.  The drainage may be dark red or brown, though some fresh blood may be seen intermittently, especially after irrigation.  Do not blow you nose, as bleeding may occur. If you must sneeze, keep your mouth open to allow air to escape through your mouth.  If heavy bleeding occurs: Irrigate the nose with saline to rinse out clots, then spray the nose 3 - 4 times with Afrin Nasal Decongestant Spray.  The spray will constrict the blood vessels to slow bleeding.  Pinch the lower half of your nose shut to apply pressure, and lay down with your head elevated.  Ice packs over the nose may help as well. If bleeding persists despite these measures, you should notify your doctor.  Do not use the Afrin routinely to control nasal congestion after surgery, as it can result in worsening congestion and may affect healing.     Activity: Return to work varies among patients. Most patients will be   out of work at least 5 - 7 days to recover.  Patient may return to work after they are off of narcotic pain medication, and feeling well enough to perform the functions of their job.  Patients must avoid heavy lifting (over 10 pounds) or strenuous physical for 2 weeks after surgery, so your employer may need to assign you to light duty, or keep you out of work longer if light duty is not possible.  NOTE: you should not drive, operate dangerous machinery, do any mentally demanding tasks or make any important legal or financial decisions while on narcotic pain medication and recovering from the general anesthetic.    Call Your Doctor Immediately if You Have Any of the Following: Bleeding that you cannot control with the above measures Loss of vision, double vision, bulging of the eye or black eyes. Fever over 101 degrees Neck stiffness with severe headache,  fever, nausea and change in mental state. You are always encourage to call anytime with concerns, however, please call with requests for pain medication refills during office hours.  Office Endoscopy: During follow-up visits your doctor will remove any packing or splints that may have been placed and evaluate and clean your sinuses endoscopically.  Topical anesthetic will be used to make this as comfortable as possible, though you may want to take your pain medication prior to the visit.  How often this will need to be done varies from patient to patient.  After complete recovery from the surgery, you may need follow-up endoscopy from time to time, particularly if there is concern of recurrent infection or nasal polyps.   

## 2021-03-04 ENCOUNTER — Encounter
Admission: RE | Disposition: A | Discharge status: Home / Self Care | Payer: Self-pay | Source: Home / Self Care | Attending: Otolaryngology

## 2021-03-04 ENCOUNTER — Ambulatory Visit: Payer: Medicaid Other | Admitting: Anesthesiology

## 2021-03-04 ENCOUNTER — Ambulatory Visit
Admission: RE | Admit: 2021-03-04 | Discharge: 2021-03-04 | Disposition: A | Discharge status: Home / Self Care | Payer: Medicaid Other | Attending: Otolaryngology | Admitting: Otolaryngology

## 2021-03-04 ENCOUNTER — Other Ambulatory Visit: Payer: Self-pay

## 2021-03-04 ENCOUNTER — Encounter: Payer: Self-pay | Admitting: Otolaryngology

## 2021-03-04 DIAGNOSIS — J3489 Other specified disorders of nose and nasal sinuses: Secondary | ICD-10-CM | POA: Diagnosis not present

## 2021-03-04 DIAGNOSIS — J343 Hypertrophy of nasal turbinates: Secondary | ICD-10-CM | POA: Diagnosis not present

## 2021-03-04 DIAGNOSIS — J342 Deviated nasal septum: Secondary | ICD-10-CM | POA: Insufficient documentation

## 2021-03-04 HISTORY — PX: ENDOSCOPIC CONCHA BULLOSA RESECTION: SHX6395

## 2021-03-04 HISTORY — PX: NASAL SEPTOPLASTY W/ TURBINOPLASTY: SHX2070

## 2021-03-04 LAB — POCT PREGNANCY, URINE: Preg Test, Ur: NEGATIVE

## 2021-03-04 SURGERY — SEPTOPLASTY, NOSE, WITH NASAL TURBINATE REDUCTION
Anesthesia: General | Site: Nose | Laterality: Left

## 2021-03-04 MED ORDER — OXYCODONE-ACETAMINOPHEN 5-325 MG PO TABS: 1.0000 | ORAL_TABLET | Freq: Four times a day (QID) | ORAL | 0 refills | Status: AC | PRN

## 2021-03-04 MED ORDER — ONDANSETRON HCL 4 MG/2ML IJ SOLN
INTRAMUSCULAR | Status: DC | PRN
  Administered 2021-03-04: 4 mg via INTRAVENOUS

## 2021-03-04 MED ORDER — SODIUM CHLORIDE 0.9 % IR SOLN
Status: DC | PRN
  Administered 2021-03-04: 500 mL

## 2021-03-04 MED ORDER — ACETAMINOPHEN 10 MG/ML IV SOLN
1000.0000 mg | Freq: Once | INTRAVENOUS | Status: AC
  Administered 2021-03-04: 1000 mg via INTRAVENOUS

## 2021-03-04 MED ORDER — GLYCOPYRROLATE 0.2 MG/ML IJ SOLN
INTRAMUSCULAR | Status: DC | PRN
  Administered 2021-03-04: .1 mg via INTRAVENOUS

## 2021-03-04 MED ORDER — OXYCODONE HCL 5 MG PO TABS
5.0000 mg | ORAL_TABLET | Freq: Once | ORAL | Status: AC | PRN
  Administered 2021-03-04: 5 mg via ORAL

## 2021-03-04 MED ORDER — PROPOFOL 10 MG/ML IV BOLUS
INTRAVENOUS | Status: DC | PRN
  Administered 2021-03-04: 130 mg via INTRAVENOUS

## 2021-03-04 MED ORDER — DEXAMETHASONE SODIUM PHOSPHATE 4 MG/ML IJ SOLN
INTRAMUSCULAR | Status: DC | PRN
  Administered 2021-03-04: 10 mg via INTRAVENOUS

## 2021-03-04 MED ORDER — MIDAZOLAM HCL 5 MG/5ML IJ SOLN
INTRAMUSCULAR | Status: DC | PRN
  Administered 2021-03-04: 2 mg via INTRAVENOUS

## 2021-03-04 MED ORDER — ONDANSETRON HCL 4 MG PO TABS: 4.0000 mg | ORAL_TABLET | Freq: Three times a day (TID) | ORAL | 0 refills | Status: DC | PRN

## 2021-03-04 MED ORDER — FENTANYL CITRATE (PF) 100 MCG/2ML IJ SOLN
INTRAMUSCULAR | Status: DC | PRN
  Administered 2021-03-04: 50 ug via INTRAVENOUS

## 2021-03-04 MED ORDER — AMOXICILLIN-POT CLAVULANATE 875-125 MG PO TABS: 1.0000 | ORAL_TABLET | Freq: Two times a day (BID) | ORAL | 0 refills | Status: DC

## 2021-03-04 MED ORDER — SCOPOLAMINE 1 MG/3DAYS TD PT72
1.0000 | MEDICATED_PATCH | TRANSDERMAL | Status: DC
  Administered 2021-03-04: 1.5 mg via TRANSDERMAL

## 2021-03-04 MED ORDER — LIDOCAINE-EPINEPHRINE 1 %-1:100000 IJ SOLN
INTRAMUSCULAR | Status: DC | PRN
  Administered 2021-03-04: 9 mL via INTRADERMAL

## 2021-03-04 MED ORDER — ACETAMINOPHEN 10 MG/ML IV SOLN
INTRAVENOUS | Status: DC | PRN
  Administered 2021-03-04: 1000 mg via INTRAVENOUS

## 2021-03-04 MED ORDER — OXYMETAZOLINE HCL 0.05 % NA SOLN
NASAL | Status: DC | PRN
  Administered 2021-03-04: 1 via TOPICAL

## 2021-03-04 MED ORDER — FENTANYL CITRATE PF 50 MCG/ML IJ SOSY
25.0000 ug | PREFILLED_SYRINGE | INTRAMUSCULAR | Status: DC | PRN
  Administered 2021-03-04 (×2): 25 ug via INTRAVENOUS

## 2021-03-04 MED ORDER — LACTATED RINGERS IV SOLN: INTRAVENOUS | Status: DC

## 2021-03-04 MED ORDER — ONDANSETRON HCL 4 MG/2ML IJ SOLN: 4.0000 mg | Freq: Once | INTRAMUSCULAR | Status: DC | PRN

## 2021-03-04 MED ORDER — OXYCODONE HCL 5 MG/5ML PO SOLN: 5.0000 mg | Freq: Once | ORAL | Status: AC | PRN

## 2021-03-04 MED ORDER — SUCCINYLCHOLINE CHLORIDE 200 MG/10ML IV SOSY
PREFILLED_SYRINGE | INTRAVENOUS | Status: DC | PRN
  Administered 2021-03-04: 80 mg via INTRAVENOUS

## 2021-03-04 MED ORDER — LIDOCAINE HCL (CARDIAC) PF 100 MG/5ML IV SOSY
PREFILLED_SYRINGE | INTRAVENOUS | Status: DC | PRN
  Administered 2021-03-04: 50 mg via INTRAVENOUS

## 2021-03-04 SURGICAL SUPPLY — 22 items
CANISTER SUCT 1200ML W/VALVE (MISCELLANEOUS) ×3 IMPLANT
COAG SUCT 10F 3.5MM HAND CTRL (MISCELLANEOUS) ×3 IMPLANT
DRESSING NASL FOAM PST OP SINU (MISCELLANEOUS) IMPLANT
DRSG NASAL FOAM POST OP SINU (MISCELLANEOUS)
ELECT REM PT RETURN 9FT ADLT (ELECTROSURGICAL) ×3
ELECTRODE REM PT RTRN 9FT ADLT (ELECTROSURGICAL) ×2 IMPLANT
GLOVE SURG GAMMEX PI TX LF 7.5 (GLOVE) ×6 IMPLANT
KIT TURNOVER KIT A (KITS) ×3 IMPLANT
NDL HYPO 25GX1X1/2 BEV (NEEDLE) ×2 IMPLANT
NEEDLE HYPO 25GX1X1/2 BEV (NEEDLE) ×3 IMPLANT
PACK ENT CUSTOM (PACKS) ×3 IMPLANT
PATTIES SURGICAL .5 X3 (DISPOSABLE) ×3 IMPLANT
SOL ANTI-FOG 6CC FOG-OUT (MISCELLANEOUS) ×2 IMPLANT
SOL FOG-OUT ANTI-FOG 6CC (MISCELLANEOUS) ×1
SPLINT NASAL SEPTAL BLV .50 ST (MISCELLANEOUS) ×3 IMPLANT
STRAP BODY AND KNEE 60X3 (MISCELLANEOUS) ×3 IMPLANT
SUT CHROMIC 4 0 RB 1X27 (SUTURE) ×3 IMPLANT
SUT ETHILON 3-0 FS-10 30 BLK (SUTURE) ×3
SUTURE EHLN 3-0 FS-10 30 BLK (SUTURE) ×2 IMPLANT
SYR 10ML LL (SYRINGE) ×3 IMPLANT
TOWEL OR 17X26 4PK STRL BLUE (TOWEL DISPOSABLE) ×3 IMPLANT
WATER STERILE IRR 250ML POUR (IV SOLUTION) IMPLANT

## 2021-03-04 NOTE — Anesthesia Procedure Notes (Signed)
Procedure Name: Intubation Date/Time: 03/04/2021 10:30 AM Performed by: Jimmy Picket, CRNA Pre-anesthesia Checklist: Patient identified, Emergency Drugs available, Suction available, Patient being monitored and Timeout performed Patient Re-evaluated:Patient Re-evaluated prior to induction Oxygen Delivery Method: Circle system utilized Preoxygenation: Pre-oxygenation with 100% oxygen Induction Type: IV induction Ventilation: Mask ventilation without difficulty Laryngoscope Size: Miller and 2 Grade View: Grade I Tube type: Oral Rae Tube size: 7.0 mm Number of attempts: 1 Placement Confirmation: ETT inserted through vocal cords under direct vision, positive ETCO2 and breath sounds checked- equal and bilateral Tube secured with: Tape Dental Injury: Teeth and Oropharynx as per pre-operative assessment

## 2021-03-04 NOTE — Op Note (Signed)
..03/04/2021  11:50 AM    Carrie Vasquez  [id number]    Pre-Op Dx:  Deviated Nasal Septum, Hypertrophic Inferior Turbinates  Post-op Dx: Same  Proc:   1)  Nasal Septoplasty 2)  Bilateral Partial Reduction Inferior Turbinates  3)  Left Concha bullosa resection  Surg:  Roney Mans Berry Godsey  Anes:  GOT  EBL:  48ml  Comp:  none  Findings: severe right sided septal deviation and spur with large bone and cartilage component narrowing bilateral airways.  Large left sided septal deviation  Procedure: With the patient in a comfortable supine position,  general orotracheal anesthesia was induced without difficulty.  The patient received preoperative Afrin spray for topical decongestion and vasoconstriction.  At an appropriate level, the patient was placed in a semi-sitting position.  Nasal vibrissae were trimmed.   1% Xylocaine with 1:100,000 epinephrine, 8 cc's, was infiltrated into the anterior floor of the nose, into the nasal spine region, into the membranous columella, and finally into the submucoperichondrial plane of the septum on both sides.  Several minutes were allowed for this to take effect.  Cottoniod pledgetts soaked in Afrin were placed into both nasal cavities and left while the patient was prepped and draped in the standard fashion.   A proper time-out was performed.  The materials were removed from the nose and observed to be intact and correct in number.  The nose was inspected with a headlight and zero degree endoscope with the findings as described above.  A left Killian incision was sharply executed and carried down to the caudal edge of the quadrangular cartilage with a 15 blade scapel.  A mucoperichondrial flap was elelvated along the quadrangular plate back to the bony-cartilaginous junction using caudal elevator and freer elevator. The mucoperiostium was then elevated along the ethmoid plate and the vomer. An itracartilagenous incision was made using the freer elevator  and a contralateral mucoperichondiral flap was elevated using a freer elevator.  Care was taken to avoid any large rents or opposing rents in the mucoperichondrial flap.  Boney spurs of the vomer and maxillary crest were removed with Takahashi forceps.  The area of cartilagenous deviation was removed with combination of freer elevator and Takahashi forceps creating a widely patent nasal cavity as well as resolution of obstruction from the cartilagenous deviation. The mucosal flaps were placed back into their anatomic position to allow visualization of the airways. The septum now sat in the midline with an improved airway.  A 4-0 Chromic was used to close the Bartlett incision as well.   The inferior turbinates were then inspected.  Under endoscopic visualization, the inferior turbinates were infractured bilaterally with a Therapist, nutritional.  A kelly clamp was attached to the anterior-inferior third of each inferior turbinate for approximately one minute.  Under endoscopic visualization, Tru-cutting forceps were used to remove the anterior-inferior third of each inferior turbinate.  Electrocautery was used to control bleeding in the area. The remaining turbinate was then outfractured to open up the airway further. There was no significant bleeding noted. The right turbinate was then trimmed and outfractured in a similar fashion.  Attention was next directed to the left concha bullosa resection.  The middle turbinate was medialized and a Therapist, nutritional was used to incise the middle turbinate.  The lateral side of the concha bullosa was next resected with tru-cutting forceps.  The lateral edge was next cauterized with Bovie suction cautery and medialized.  The airways were then visualized and showed open passageways on both sides that  were significantly improved compared to before surgery.  There was no signifcant bleeding. Nasal splints were applied to both sides of the septum using Xomed 0.63mm regular sized  splints that were trimmed, and then held in position with a 3-0 Nylon through and through suture.  Stamberger sinufoam was placed along the cut edge of the inferior turbinates bilaterally.  Xerogel was placed on the lateral edge of the left middle turbinate.  The patient was turned back over to anesthesia, and awakened, extubated, and taken to the PACU in satisfactory condition.  Dispo:   PACU to home  Plan: Ice, elevation, narcotic analgesia, steroid taper, and prophylactic antibiotics for the duration of indwelling nasal foreign bodies.  We will reevaluate the patient in the office in 6 to 7 days and remove the septal splints.  Return to work in 10 days, strenuous activities in two weeks.   Roney Mans Carrie Vasquez 03/04/2021 11:50 AM

## 2021-03-04 NOTE — Anesthesia Preprocedure Evaluation (Signed)
Anesthesia Evaluation  Patient identified by MRN, date of birth, ID band Patient awake    Reviewed: Allergy & Precautions, NPO status , Patient's Chart, lab work & pertinent test results  History of Anesthesia Complications Negative for: history of anesthetic complications  Airway Mallampati: I  TM Distance: >3 FB Neck ROM: full    Dental no notable dental hx.    Pulmonary neg pulmonary ROS, former smoker,    Pulmonary exam normal        Cardiovascular Normal cardiovascular exam Rhythm:regular Rate:Normal     Neuro/Psych negative neurological ROS     GI/Hepatic negative GI ROS, Neg liver ROS,   Endo/Other  negative endocrine ROS  Renal/GU negative Renal ROS  negative genitourinary   Musculoskeletal   Abdominal   Peds  Hematology negative hematology ROS (+)   Anesthesia Other Findings   Reproductive/Obstetrics                             Anesthesia Physical Anesthesia Plan  ASA: 1  Anesthesia Plan: General ETT   Post-op Pain Management:    Induction:   PONV Risk Score and Plan: 3 and Ondansetron, Dexamethasone, Midazolam and Treatment may vary due to age or medical condition  Airway Management Planned:   Additional Equipment:   Intra-op Plan:   Post-operative Plan:   Informed Consent: I have reviewed the patients History and Physical, chart, labs and discussed the procedure including the risks, benefits and alternatives for the proposed anesthesia with the patient or authorized representative who has indicated his/her understanding and acceptance.       Plan Discussed with:   Anesthesia Plan Comments:         Anesthesia Quick Evaluation

## 2021-03-04 NOTE — H&P (Signed)
..  History and Physical paper copy reviewed and updated date of procedure and will be scanned into system.  Patient seen and examined.  

## 2021-03-04 NOTE — Anesthesia Postprocedure Evaluation (Signed)
Anesthesia Post Note  Patient: Carrie Vasquez  Procedure(s) Performed: NASAL SEPTOPLASTY WITH TURBINATE REDUCTION (Bilateral: Nose) ENDOSCOPIC CONCHA BULLOSA RESECTION (Left: Nose)     Patient location during evaluation: Phase II Anesthesia Type: General Level of consciousness: awake and alert Pain management: pain level controlled Vital Signs Assessment: post-procedure vital signs reviewed and stable Respiratory status: spontaneous breathing Cardiovascular status: stable Anesthetic complications: no   No notable events documented.  Marvis Repress

## 2021-03-04 NOTE — Transfer of Care (Signed)
Immediate Anesthesia Transfer of Care Note  Patient: Carrie Vasquez  Procedure(s) Performed: NASAL SEPTOPLASTY WITH TURBINATE REDUCTION (Bilateral: Nose) ENDOSCOPIC CONCHA BULLOSA RESECTION (Left: Nose)  Patient Location: PACU  Anesthesia Type: General ETT  Level of Consciousness: awake, alert  and patient cooperative  Airway and Oxygen Therapy: Patient Spontanous Breathing and Patient connected to supplemental oxygen  Post-op Assessment: Post-op Vital signs reviewed, Patient's Cardiovascular Status Stable, Respiratory Function Stable, Patent Airway and No signs of Nausea or vomiting  Post-op Vital Signs: Reviewed and stable  Complications: No notable events documented.

## 2021-03-05 ENCOUNTER — Encounter: Payer: Self-pay | Admitting: Otolaryngology

## 2021-04-27 DIAGNOSIS — Z1389 Encounter for screening for other disorder: Secondary | ICD-10-CM | POA: Diagnosis not present

## 2021-04-27 DIAGNOSIS — F902 Attention-deficit hyperactivity disorder, combined type: Secondary | ICD-10-CM | POA: Diagnosis not present

## 2021-04-27 DIAGNOSIS — Z79899 Other long term (current) drug therapy: Secondary | ICD-10-CM | POA: Diagnosis not present

## 2021-05-25 DIAGNOSIS — F902 Attention-deficit hyperactivity disorder, combined type: Secondary | ICD-10-CM | POA: Diagnosis not present

## 2021-09-11 DIAGNOSIS — F902 Attention-deficit hyperactivity disorder, combined type: Secondary | ICD-10-CM | POA: Diagnosis not present

## 2021-09-16 ENCOUNTER — Encounter: Payer: Medicaid Other | Admitting: Certified Nurse Midwife

## 2021-12-10 DIAGNOSIS — Z124 Encounter for screening for malignant neoplasm of cervix: Secondary | ICD-10-CM | POA: Diagnosis not present

## 2021-12-10 DIAGNOSIS — N920 Excessive and frequent menstruation with regular cycle: Secondary | ICD-10-CM | POA: Diagnosis not present

## 2021-12-10 DIAGNOSIS — Z1151 Encounter for screening for human papillomavirus (HPV): Secondary | ICD-10-CM | POA: Diagnosis not present

## 2021-12-10 DIAGNOSIS — R1032 Left lower quadrant pain: Secondary | ICD-10-CM | POA: Diagnosis not present

## 2021-12-10 DIAGNOSIS — Z113 Encounter for screening for infections with a predominantly sexual mode of transmission: Secondary | ICD-10-CM | POA: Diagnosis not present

## 2021-12-23 DIAGNOSIS — F902 Attention-deficit hyperactivity disorder, combined type: Secondary | ICD-10-CM | POA: Diagnosis not present

## 2022-01-06 DIAGNOSIS — R1032 Left lower quadrant pain: Secondary | ICD-10-CM | POA: Diagnosis not present

## 2022-01-06 DIAGNOSIS — N921 Excessive and frequent menstruation with irregular cycle: Secondary | ICD-10-CM | POA: Diagnosis not present

## 2022-03-25 DIAGNOSIS — J301 Allergic rhinitis due to pollen: Secondary | ICD-10-CM | POA: Diagnosis not present

## 2022-03-25 DIAGNOSIS — J320 Chronic maxillary sinusitis: Secondary | ICD-10-CM | POA: Diagnosis not present

## 2022-03-25 DIAGNOSIS — J3489 Other specified disorders of nose and nasal sinuses: Secondary | ICD-10-CM | POA: Diagnosis not present

## 2022-04-16 DIAGNOSIS — J320 Chronic maxillary sinusitis: Secondary | ICD-10-CM | POA: Diagnosis not present

## 2022-04-16 DIAGNOSIS — R0683 Snoring: Secondary | ICD-10-CM | POA: Diagnosis not present

## 2022-04-16 DIAGNOSIS — J301 Allergic rhinitis due to pollen: Secondary | ICD-10-CM | POA: Diagnosis not present

## 2022-07-16 DIAGNOSIS — F902 Attention-deficit hyperactivity disorder, combined type: Secondary | ICD-10-CM | POA: Diagnosis not present

## 2023-01-25 IMAGING — CT CT NECK W/ CM
2 of 3 series · 8 of 14 positions shown, 10 images · IV contrast (omnipaque)
Comparison: None

CLINICAL DATA: Cyst of the pharynx.

EXAM:
CT NECK WITH CONTRAST
TECHNIQUE: Multidetector CT imaging of the neck was performed using the
standard protocol following the bolus administration of intravenous
contrast.
CONTRAST:  75mL OMNIPAQUE IOHEXOL 300 MG/ML  SOLN

[Series 2: axial neck · axial · 0.52mm/px · z∈[-495,-371]mm · 3 of 125 slices shown]
[im 32/125  bone]
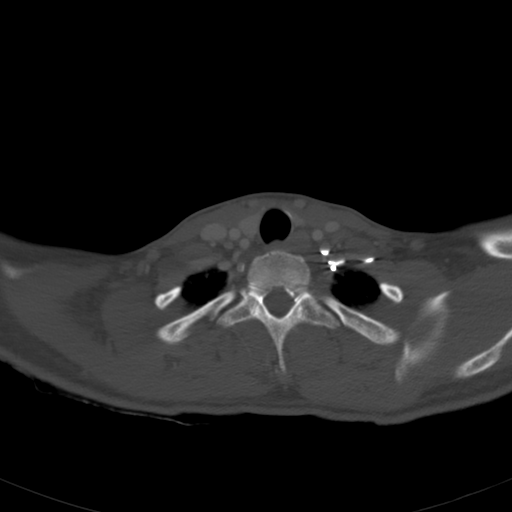
[im 63/125  bone]
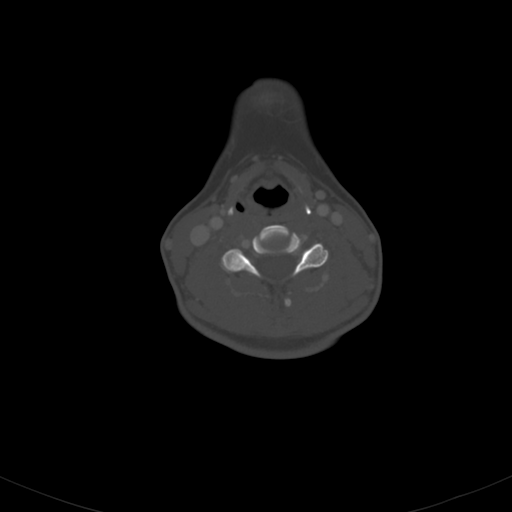
[im 94/125  bone]
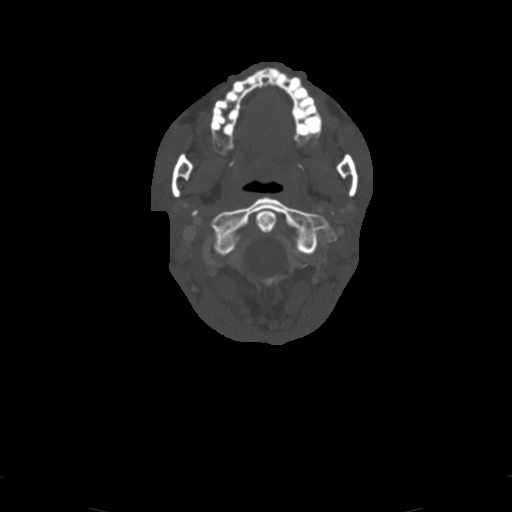

[Series 7: orthogonal ax · axial · 0.41mm/px · z∈[-560,-357]mm · 5 of 156 slices shown, 7 images]
[im 26/156  soft-tissue]
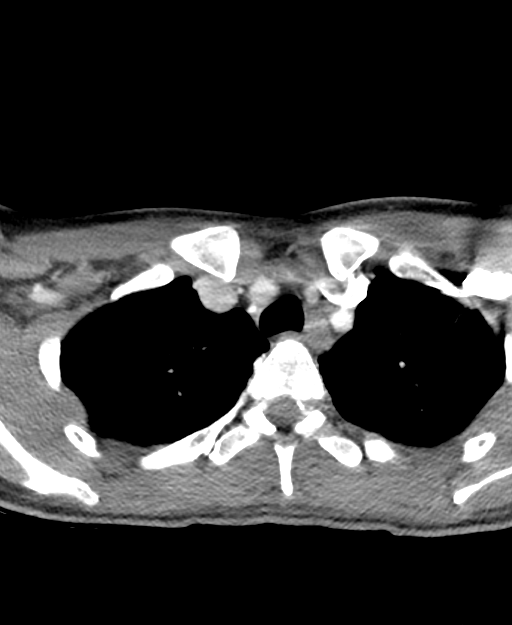
[im 26/156  bone]
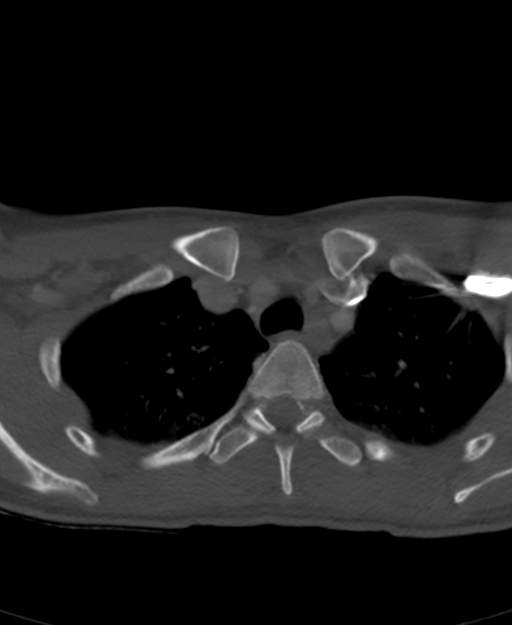
[im 52/156  bone]
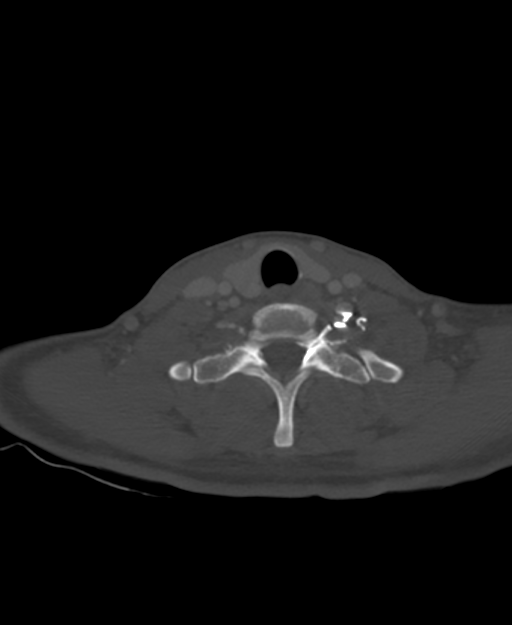
[im 78/156  bone]
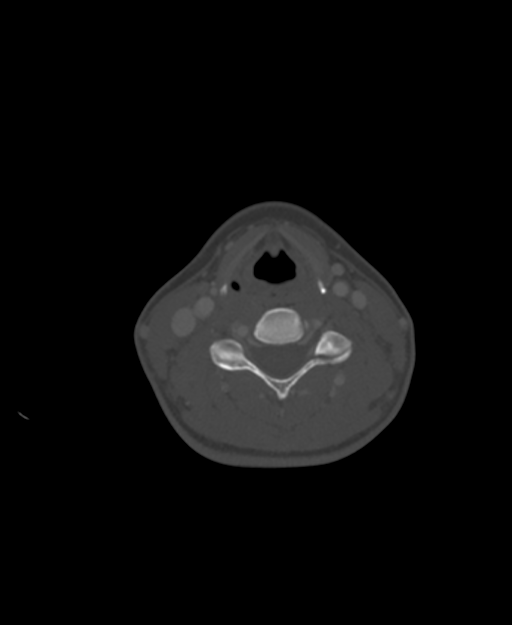
[im 104/156  bone]
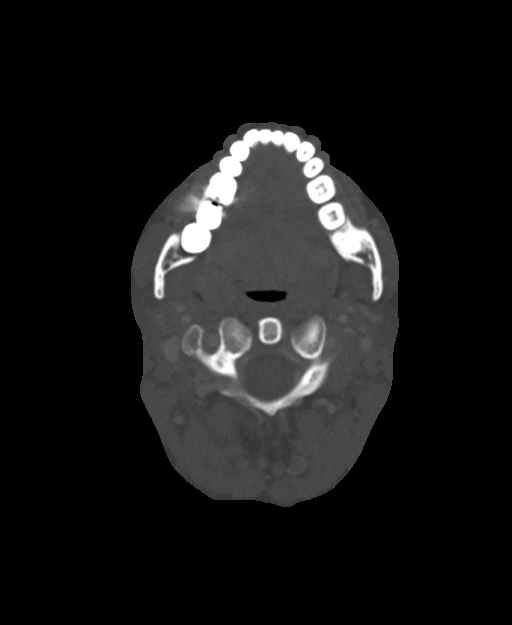
[im 130/156  soft-tissue]
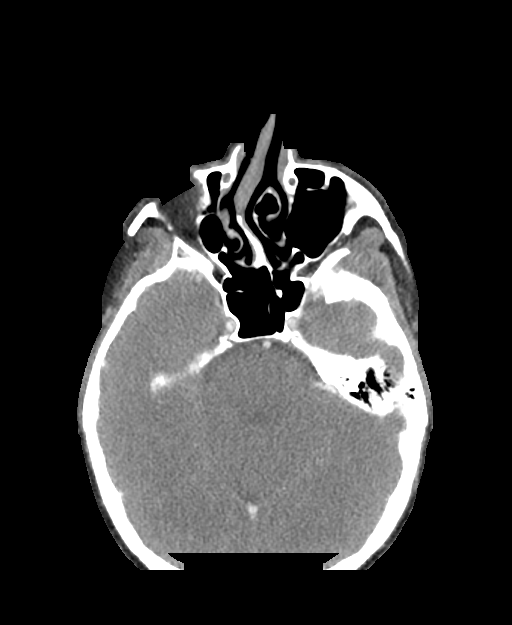
[im 130/156  bone]
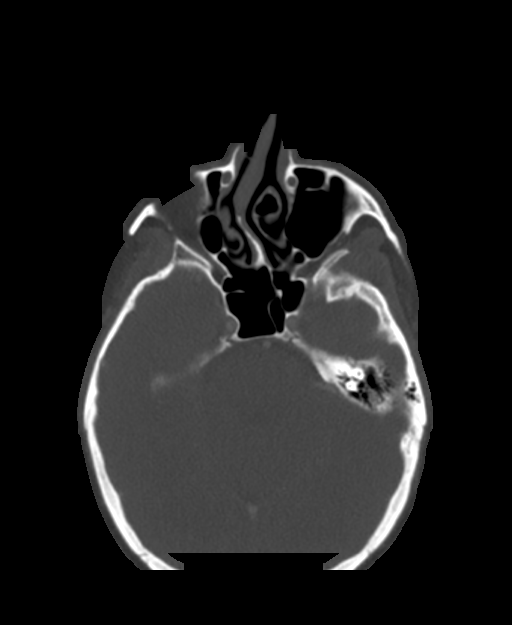

[8 of 14 positions shown; findings below may reference images not displayed]

FINDINGS: Pharynx and larynx: No well-defined lesion is present. There is some
indistinctness of the left palatine tonsil in some stranding left
parapharyngeal space. No cyst or mass lesion is present. Oropharynx
is otherwise unremarkable. Soft palate and tongue base are normal.
Nasopharynx is within normal limits. Vallecula and epiglottis are
within normal limits. Aryepiglottic folds and piriform sinuses are
clear. Vocal cords are midline and symmetric. Trachea is clear.

Salivary glands: Submandibular and parotid glands and ducts are
within normal limits.

Thyroid: Normal.

Lymph nodes: No significant cervical adenopathy is present. The
jugulodigastric lymph node is larger on the left than the right.
This may be reactive.

Vascular: No significant vascular changes noted in the neck.

Limited intracranial: Visualized intracranial contents are within
normal limits.

Visualized orbits: The globes and orbits are within normal limits.

Mastoids and visualized paranasal sinuses: Mild mucosal thickening
is present along the floor left maxillary sinus. The paranasal
sinuses and mastoid air cells are otherwise clear.

Skeleton: Posterior osteophytes noted at C5-6 narrow the central
canal. No focal lesions are present.

Upper chest: Lung apices are clear. Thoracic inlet is within normal
limits.
IMPRESSION: 1. Mild indistinctness of the left palatine tonsil and some
stranding left parapharyngeal space without a well-defined lesion.
This may represent a focal pharyngitis.
2. No cyst or mass lesion.
3. No significant cervical adenopathy.
4. Posterior osteophytes at C5-6 narrow the central canal.

## 2024-04-25 ENCOUNTER — Other Ambulatory Visit: Payer: Self-pay | Admitting: Otolaryngology

## 2024-04-27 ENCOUNTER — Encounter: Payer: Self-pay | Admitting: Otolaryngology

## 2024-05-02 ENCOUNTER — Ambulatory Visit: Payer: Self-pay | Admitting: Anesthesiology

## 2024-05-02 ENCOUNTER — Encounter: Payer: Self-pay | Admitting: Otolaryngology

## 2024-05-02 ENCOUNTER — Ambulatory Visit
Admission: RE | Admit: 2024-05-02 | Discharge: 2024-05-02 | Disposition: A | Discharge status: Home / Self Care | Attending: Otolaryngology | Admitting: Otolaryngology

## 2024-05-02 ENCOUNTER — Other Ambulatory Visit: Payer: Self-pay

## 2024-05-02 ENCOUNTER — Encounter
Admission: RE | Disposition: A | Discharge status: Home / Self Care | Payer: Self-pay | Source: Home / Self Care | Attending: Otolaryngology

## 2024-05-02 DIAGNOSIS — S022XXA Fracture of nasal bones, initial encounter for closed fracture: Secondary | ICD-10-CM | POA: Diagnosis present

## 2024-05-02 DIAGNOSIS — I10 Essential (primary) hypertension: Secondary | ICD-10-CM | POA: Insufficient documentation

## 2024-05-02 DIAGNOSIS — Z87891 Personal history of nicotine dependence: Secondary | ICD-10-CM | POA: Insufficient documentation

## 2024-05-02 DIAGNOSIS — F419 Anxiety disorder, unspecified: Secondary | ICD-10-CM | POA: Insufficient documentation

## 2024-05-02 DIAGNOSIS — F32A Depression, unspecified: Secondary | ICD-10-CM | POA: Diagnosis not present

## 2024-05-02 DIAGNOSIS — X58XXXA Exposure to other specified factors, initial encounter: Secondary | ICD-10-CM | POA: Diagnosis not present

## 2024-05-02 DIAGNOSIS — J342 Deviated nasal septum: Secondary | ICD-10-CM | POA: Diagnosis not present

## 2024-05-02 HISTORY — DX: Attention-deficit hyperactivity disorder, unspecified type: F90.9

## 2024-05-02 HISTORY — PX: CLOSED REDUCTION NASAL FRACTURE: SHX5365

## 2024-05-02 LAB — POCT PREGNANCY, URINE: Preg Test, Ur: NEGATIVE

## 2024-05-02 SURGERY — CLOSED REDUCTION, FRACTURE, NASAL BONE
Anesthesia: General | Site: Nose | Laterality: Bilateral

## 2024-05-02 MED ORDER — SODIUM CHLORIDE 0.9 % IV SOLN: INTRAVENOUS | Status: DC

## 2024-05-02 MED ORDER — PHENYLEPHRINE 80 MCG/ML (10ML) SYRINGE FOR IV PUSH (FOR BLOOD PRESSURE SUPPORT)
PREFILLED_SYRINGE | INTRAVENOUS | Status: AC
  Filled 2024-05-02: qty 10

## 2024-05-02 MED ORDER — SUCCINYLCHOLINE CHLORIDE 200 MG/10ML IV SOSY
PREFILLED_SYRINGE | INTRAVENOUS | Status: DC | PRN
  Administered 2024-05-02: 100 mg via INTRAVENOUS

## 2024-05-02 MED ORDER — PHENYLEPHRINE HCL (PRESSORS) 10 MG/ML IV SOLN
INTRAVENOUS | Status: DC | PRN
  Administered 2024-05-02: 80 ug via INTRAVENOUS

## 2024-05-02 MED ORDER — MIDAZOLAM HCL 2 MG/2ML IJ SOLN
INTRAMUSCULAR | Status: AC
  Filled 2024-05-02: qty 2

## 2024-05-02 MED ORDER — ONDANSETRON HCL 4 MG PO TABS: 4.0000 mg | ORAL_TABLET | Freq: Three times a day (TID) | ORAL | 0 refills | Status: AC | PRN

## 2024-05-02 MED ORDER — DEXAMETHASONE SODIUM PHOSPHATE 4 MG/ML IJ SOLN
INTRAMUSCULAR | Status: DC | PRN
  Administered 2024-05-02: 4 mg via INTRAVENOUS

## 2024-05-02 MED ORDER — SUCCINYLCHOLINE CHLORIDE 200 MG/10ML IV SOSY
PREFILLED_SYRINGE | INTRAVENOUS | Status: AC
  Filled 2024-05-02: qty 10

## 2024-05-02 MED ORDER — LIDOCAINE HCL (CARDIAC) PF 100 MG/5ML IV SOSY
PREFILLED_SYRINGE | INTRAVENOUS | Status: DC | PRN
  Administered 2024-05-02: 100 mg via INTRAVENOUS

## 2024-05-02 MED ORDER — FENTANYL CITRATE (PF) 100 MCG/2ML IJ SOLN
INTRAMUSCULAR | Status: DC | PRN
  Administered 2024-05-02 (×2): 50 ug via INTRAVENOUS

## 2024-05-02 MED ORDER — OXYMETAZOLINE HCL 0.05 % NA SOLN
NASAL | Status: DC | PRN
  Administered 2024-05-02: 1

## 2024-05-02 MED ORDER — HEMOSTATIC AGENTS (NO CHARGE) OPTIME
TOPICAL | Status: DC | PRN
  Administered 2024-05-02: 1 via TOPICAL

## 2024-05-02 MED ORDER — PROPOFOL 10 MG/ML IV BOLUS
INTRAVENOUS | Status: DC | PRN
  Administered 2024-05-02: 200 mg via INTRAVENOUS
  Administered 2024-05-02: 30 mg via INTRAVENOUS

## 2024-05-02 MED ORDER — LIDOCAINE-EPINEPHRINE 1 %-1:100000 IJ SOLN
INTRAMUSCULAR | Status: DC | PRN
  Administered 2024-05-02: 8.5 mL

## 2024-05-02 MED ORDER — PROPOFOL 10 MG/ML IV BOLUS
INTRAVENOUS | Status: AC
Start: 2024-05-02 — End: 2024-05-02
  Filled 2024-05-02: qty 20

## 2024-05-02 MED ORDER — LACTATED RINGERS IV SOLN: INTRAVENOUS | Status: DC

## 2024-05-02 MED ORDER — ONDANSETRON HCL 4 MG/2ML IJ SOLN
INTRAMUSCULAR | Status: AC
  Filled 2024-05-02: qty 2

## 2024-05-02 MED ORDER — IBUPROFEN 200 MG PO TABS
ORAL_TABLET | ORAL | Status: AC
  Filled 2024-05-02: qty 3

## 2024-05-02 MED ORDER — ACETAMINOPHEN 500 MG PO TABS
ORAL_TABLET | ORAL | Status: AC
  Filled 2024-05-02: qty 2

## 2024-05-02 MED ORDER — ACETAMINOPHEN 500 MG PO TABS
1000.0000 mg | ORAL_TABLET | Freq: Once | ORAL | Status: AC
  Administered 2024-05-02: 1000 mg via ORAL

## 2024-05-02 MED ORDER — OXYCODONE-ACETAMINOPHEN 5-325 MG PO TABS: 1.0000 | ORAL_TABLET | ORAL | 0 refills | Status: AC | PRN

## 2024-05-02 MED ORDER — IBUPROFEN 600 MG PO TABS
600.0000 mg | ORAL_TABLET | Freq: Once | ORAL | Status: AC
  Administered 2024-05-02: 600 mg via ORAL

## 2024-05-02 MED ORDER — MIDAZOLAM HCL 5 MG/5ML IJ SOLN
INTRAMUSCULAR | Status: DC | PRN
  Administered 2024-05-02: 2 mg via INTRAVENOUS

## 2024-05-02 MED ORDER — FENTANYL CITRATE (PF) 100 MCG/2ML IJ SOLN
INTRAMUSCULAR | Status: AC
  Filled 2024-05-02: qty 2

## 2024-05-02 MED ORDER — DEXMEDETOMIDINE HCL 200 MCG/2ML IV SOLN
INTRAVENOUS | Status: AC
  Filled 2024-05-02: qty 2

## 2024-05-02 MED ORDER — ONDANSETRON HCL 4 MG/2ML IJ SOLN
INTRAMUSCULAR | Status: DC | PRN
  Administered 2024-05-02: 4 mg via INTRAVENOUS

## 2024-05-02 MED ORDER — LIDOCAINE HCL (PF) 2 % IJ SOLN
INTRAMUSCULAR | Status: AC
  Filled 2024-05-02: qty 5

## 2024-05-02 MED ORDER — DEXAMETHASONE SODIUM PHOSPHATE 4 MG/ML IJ SOLN
INTRAMUSCULAR | Status: AC
  Filled 2024-05-02: qty 1

## 2024-05-02 MED ORDER — DEXMEDETOMIDINE HCL IN NACL 80 MCG/20ML IV SOLN
INTRAVENOUS | Status: DC | PRN
  Administered 2024-05-02 (×3): 4 ug via INTRAVENOUS

## 2024-05-02 SURGICAL SUPPLY — 19 items
ADHESIVE MASTISOL STRL (MISCELLANEOUS) ×1 IMPLANT
BASIN GRAD PLASTIC 32OZ STRL (MISCELLANEOUS) ×1 IMPLANT
CANISTER SUCT 1200ML W/VALVE (MISCELLANEOUS) ×1 IMPLANT
COAG SUCTION FOOTSWITCH 10FR (SUCTIONS) ×1 IMPLANT
COVER MAYO STAND STRL (DRAPES) ×1 IMPLANT
COVER TABLE BACK 60X90 (DRAPES) ×1 IMPLANT
CUP MEDICINE 2OZ PLAST GRAD ST (MISCELLANEOUS) ×1 IMPLANT
GAUZE SPONGE 4X4 12PLY STRL (GAUZE/BANDAGES/DRESSINGS) ×1 IMPLANT
GLOVE PI ULTRA LF STRL 7.5 (GLOVE) ×1 IMPLANT
KIT TURNOVER KIT A (KITS) ×1 IMPLANT
MARKER SKIN DUAL TIP RULER LAB (MISCELLANEOUS) ×1 IMPLANT
NDL HYPO 25GX1X1/2 BEV (NEEDLE) ×1 IMPLANT
NEEDLE HYPO 25GX1X1/2 BEV (NEEDLE) ×1 IMPLANT
PATTIES SURGICAL .5 X3 (DISPOSABLE) ×1 IMPLANT
STRAP BODY AND KNEE 60X3 (MISCELLANEOUS) ×1 IMPLANT
STRIP CLOSURE SKIN 1/2X4 (GAUZE/BANDAGES/DRESSINGS) ×1 IMPLANT
SYR 10ML LL (SYRINGE) ×1 IMPLANT
TOWEL OR 17X26 4PK STRL BLUE (TOWEL DISPOSABLE) ×1 IMPLANT
TUBING SUCTION CONN 0.25 STRL (TUBING) ×1 IMPLANT

## 2024-05-02 NOTE — Anesthesia Procedure Notes (Signed)
 Procedure Name: Intubation Date/Time: 05/02/2024 8:23 AM  Performed by: Myra Lawless, CRNAPre-anesthesia Checklist: Patient identified, Patient being monitored, Timeout performed, Emergency Drugs available and Suction available Patient Re-evaluated:Patient Re-evaluated prior to induction Oxygen Delivery Method: Circle system utilized Preoxygenation: Pre-oxygenation with 100% oxygen Induction Type: IV induction Ventilation: Mask ventilation without difficulty Laryngoscope Size: Mac and 3 Grade View: Grade I Tube type: Oral Tube size: 7.0 mm Number of attempts: 1 Airway Equipment and Method: Stylet Placement Confirmation: ETT inserted through vocal cords under direct vision, positive ETCO2 and breath sounds checked- equal and bilateral Secured at: 21 cm Tube secured with: Tape Dental Injury: Teeth and Oropharynx as per pre-operative assessment

## 2024-05-02 NOTE — H&P (Signed)
..  History and Physical  reviewed and updated date of procedure and will be scanned into system.  Patient seen and examined.

## 2024-05-02 NOTE — Transfer of Care (Signed)
 Immediate Anesthesia Transfer of Care Note  Patient: Carrie Vasquez  Procedure(s) Performed: CLOSED REDUCTION, FRACTURE, NASAL BONE (Bilateral: Nose)  Patient Location: PACU  Anesthesia Type: General ETT  Level of Consciousness: awake, alert  and patient cooperative  Airway and Oxygen Therapy: Patient Spontanous Breathing and Patient connected to supplemental oxygen  Post-op Assessment: Post-op Vital signs reviewed, Patient's Cardiovascular Status Stable, Respiratory Function Stable, Patent Airway and No signs of Nausea or vomiting  Post-op Vital Signs: Reviewed and stable  Complications: No notable events documented.

## 2024-05-02 NOTE — Anesthesia Postprocedure Evaluation (Signed)
 Anesthesia Post Note  Patient: Carrie Vasquez  Procedure(s) Performed: CLOSED REDUCTION, FRACTURE, NASAL BONE (Bilateral: Nose)  Patient location during evaluation: PACU Anesthesia Type: General Level of consciousness: awake and alert Pain management: pain level controlled Vital Signs Assessment: post-procedure vital signs reviewed and stable Respiratory status: spontaneous breathing, nonlabored ventilation and respiratory function stable Cardiovascular status: blood pressure returned to baseline and stable Postop Assessment: no apparent nausea or vomiting Anesthetic complications: no   No notable events documented.   Last Vitals:  Vitals:   05/02/24 0908 05/02/24 0926  BP: 120/77 (!) 113/91  Pulse: 78 69  Resp: 20 20  Temp: 36.7 C 36.7 C  SpO2: 98% 99%    Last Pain:  Vitals:   05/02/24 0926  TempSrc:   PainSc: 0-No pain                 [name]

## 2024-05-02 NOTE — Anesthesia Preprocedure Evaluation (Addendum)
 Anesthesia Evaluation  Patient identified by MRN, date of birth, ID band Patient awake    Reviewed: Allergy & Precautions, H&P , NPO status , Patient's Chart, lab work & pertinent test results  Airway Mallampati: II  TM Distance: >3 FB Neck ROM: full    Dental no notable dental hx.    Pulmonary former smoker   Pulmonary exam normal        Cardiovascular hypertension, Normal cardiovascular exam     Neuro/Psych  PSYCHIATRIC DISORDERS Anxiety Depression    negative neurological ROS     GI/Hepatic negative GI ROS, Neg liver ROS,,,  Endo/Other  negative endocrine ROS    Renal/GU      Musculoskeletal   Abdominal   Peds  Hematology negative hematology ROS (+)   Anesthesia Other Findings Past Medical History: No date: ADHD No date: Anxiety     Comment:  does not take medication No date: Depression     Comment:  does not take medication 10/18/2015: Gestational hypertension No date: MRSA (methicillin resistant Staphylococcus aureus)     Comment:  h/o MRSA = 3-4 years ago = cleared = culture 7/22               negative No date: Pregnancy induced hypertension No date: Tobacco abuse  Past Surgical History: 1996: ADENOIDECTOMY 1993: APPENDECTOMY 03/04/2021: ENDOSCOPIC CONCHA BULLOSA RESECTION; Left     Comment:  Procedure: ENDOSCOPIC CONCHA BULLOSA RESECTION;                Surgeon: Milissa Hamming, MD;  Location: [facility]              [facility];  Service: ENT;  Laterality: Left; 03/04/2021: NASAL SEPTOPLASTY W/ TURBINOPLASTY; Bilateral     Comment:  Procedure: NASAL SEPTOPLASTY WITH TURBINATE REDUCTION;                Surgeon: Milissa Hamming, MD;  Location: [facility]              [facility];  Service: ENT;  Laterality: Bilateral; No date: WISDOM TOOTH EXTRACTION     Comment:  one removed at age 38  BMI    Body Mass Index: 21.97 kg/m      Reproductive/Obstetrics negative OB ROS                               Anesthesia Physical Anesthesia Plan  ASA: 1  Anesthesia Plan: General ETT   Post-op Pain Management: Tylenol  PO (pre-op) and Celebrex PO (pre-op)   Induction: Intravenous  PONV Risk Score and Plan: 2 and Ondansetron , Dexamethasone  and Midazolam   Airway Management Planned: Oral ETT  Additional Equipment:   Intra-op Plan:   Post-operative Plan: Extubation in OR  Informed Consent: I have reviewed the patients History and Physical, chart, labs and discussed the procedure including the risks, benefits and alternatives for the proposed anesthesia with the patient or authorized representative who has indicated his/her understanding and acceptance.     Dental Advisory Given  Plan Discussed with: CRNA and Surgeon  Anesthesia Plan Comments:          Anesthesia Quick Evaluation

## 2024-05-02 NOTE — Op Note (Signed)
..  05/02/2024  8:52 AM    Carrie Vasquez  [id number]   Pre-Op Dx:  Comminuted nasal fracture, Right septal deviation, Nasal obstruction  Post-op Dx: Same  Proc:   1)  Closed reduction of Nasal bone fracture- endoscopic assisted  Surg:  Carolee Maxx Calaway  Anes:  General  EBL:  <54ml  Comp:  None  Findings: Prior septoplasty and inferior turbinate reduction.  Comminuted nasal bone fracture with concavity of right nasal side wall and nasal bridge and convexity of left nasal sidewall.  This resulted in superior right sided bone septal deviation.  This was all reduced for good symmetry and a straighter septum  Procedure: After the patient was identified in holding and the benefits of the procedure were reviewed as well as the consent and risks.  The patient was taken to the operating room and with the patient in a comfortable supine position,  general orotracheal anesthesia was induced without difficulty.  A proper time-out was performed.  The patient next received preoperative Afrin spray for topical decongestion and vasoconstriction.  Several minutes were allowed for this to take effect.  8.35ml of 1% lidocaine  with 1:100,000 epinephrine  was injected into the patient's septum, anterior columella, inferior and middle turbinates.  The right and left sides of the nasal cavity was next inspected with a zero degree endoscope.  This demonstrated a rightward bony septal deviation in mid septum superiorly encroaching on middle turbinate.  Using an elevator this was reduced to midline.  Some mild memory effect was present and Nasopore was placed to keep the septum midline and help with hemostasis.  At this time, attention was directed to the closed reduction of the nasal bone fractures.  These were gently reduced to midline with a butter knife elevator moved the right nasal bone laterally and the left nasal bone medially.  Some residual soft tissue swelling was noted but to palpation, bilateral nasal  bones were symmetrical and midline.  At this time the nasal cavity was suctioned and meticulous hemostasis was continued.  Thermoplast splint was fashioned with mastisol on the skin, steri-strips, and then the splint.  Care of the patient at this time was transferred to Anestheisa.   Dispo:   PACU  Plan: Ice, elevation, narcotic analgesia.  Carolee Yochanan Eddleman 05/02/2024 8:52 AM
